# Patient Record
Sex: Female | Born: 1960 | Race: White | Hispanic: No | Smoking: Current every day smoker
Health system: Southern US, Community
[De-identification: ages and names within clinical notes are randomized; demographics above are authoritative.]

## PROBLEM LIST (undated history)

## (undated) DIAGNOSIS — J449 Chronic obstructive pulmonary disease, unspecified: Secondary | ICD-10-CM

## (undated) DIAGNOSIS — K219 Gastro-esophageal reflux disease without esophagitis: Secondary | ICD-10-CM

## (undated) DIAGNOSIS — C801 Malignant (primary) neoplasm, unspecified: Secondary | ICD-10-CM

## (undated) DIAGNOSIS — F1990 Other psychoactive substance use, unspecified, uncomplicated: Secondary | ICD-10-CM

## (undated) DIAGNOSIS — F32A Depression, unspecified: Secondary | ICD-10-CM

## (undated) DIAGNOSIS — K759 Inflammatory liver disease, unspecified: Secondary | ICD-10-CM

## (undated) DIAGNOSIS — R519 Headache, unspecified: Secondary | ICD-10-CM

## (undated) DIAGNOSIS — M810 Age-related osteoporosis without current pathological fracture: Secondary | ICD-10-CM

## (undated) DIAGNOSIS — T8859XA Other complications of anesthesia, initial encounter: Secondary | ICD-10-CM

## (undated) DIAGNOSIS — M797 Fibromyalgia: Secondary | ICD-10-CM

## (undated) DIAGNOSIS — M1990 Unspecified osteoarthritis, unspecified site: Secondary | ICD-10-CM

## (undated) DIAGNOSIS — F419 Anxiety disorder, unspecified: Secondary | ICD-10-CM

## (undated) DIAGNOSIS — K7460 Unspecified cirrhosis of liver: Secondary | ICD-10-CM

## (undated) HISTORY — PX: ABDOMINAL HYSTERECTOMY: SHX81

## (undated) HISTORY — DX: Age-related osteoporosis without current pathological fracture: M81.0

## (undated) HISTORY — PX: KNEE SURGERY: SHX244

## (undated) HISTORY — PX: FACIAL RECONSTRUCTION SURGERY: SHX631

---

## 2007-09-30 DIAGNOSIS — F1910 Other psychoactive substance abuse, uncomplicated: Secondary | ICD-10-CM

## 2007-09-30 HISTORY — DX: Other psychoactive substance abuse, uncomplicated: F19.10

## 2010-09-29 DIAGNOSIS — I219 Acute myocardial infarction, unspecified: Secondary | ICD-10-CM

## 2010-09-29 HISTORY — DX: Acute myocardial infarction, unspecified: I21.9

## 2021-01-09 ENCOUNTER — Encounter (HOSPITAL_COMMUNITY): Payer: Self-pay | Admitting: Emergency Medicine

## 2021-01-09 ENCOUNTER — Emergency Department (HOSPITAL_COMMUNITY)
Admission: EM | Admit: 2021-01-09 | Discharge: 2021-01-10 | Disposition: A | Discharge status: Home / Self Care | Payer: Medicaid Other | Attending: Emergency Medicine | Admitting: Emergency Medicine

## 2021-01-09 ENCOUNTER — Other Ambulatory Visit: Payer: Self-pay

## 2021-01-09 DIAGNOSIS — S8991XA Unspecified injury of right lower leg, initial encounter: Secondary | ICD-10-CM | POA: Diagnosis present

## 2021-01-09 DIAGNOSIS — S8011XA Contusion of right lower leg, initial encounter: Secondary | ICD-10-CM

## 2021-01-09 DIAGNOSIS — S8001XA Contusion of right knee, initial encounter: Secondary | ICD-10-CM | POA: Insufficient documentation

## 2021-01-09 DIAGNOSIS — Y92002 Bathroom of unspecified non-institutional (private) residence single-family (private) house as the place of occurrence of the external cause: Secondary | ICD-10-CM | POA: Diagnosis not present

## 2021-01-09 DIAGNOSIS — W010XXA Fall on same level from slipping, tripping and stumbling without subsequent striking against object, initial encounter: Secondary | ICD-10-CM | POA: Diagnosis not present

## 2021-01-09 HISTORY — DX: Fibromyalgia: M79.7

## 2021-01-09 NOTE — ED Triage Notes (Signed)
Patient slipped and fell at bathroom this evening , no LOC , reports pain at right lower leg with mild swelling .

## 2021-01-09 NOTE — ED Provider Notes (Signed)
MSE was initiated and I personally evaluated the patient and placed orders (if any) at  11:46 PM on January 09, 2021.  After bath one week ago, slipped and fell with leg folded under her. Pain at right shin. Didn't hit her head. No abdominal pain, no back pain.   Also c/o untreated fibromyalgia, requesting gabapentin as she has not established with PCP.   Today's Vitals   01/09/21 2252  BP: (!) 128/93  Pulse: 78  Resp: 20  Temp: 97.9 F (36.6 C)  TempSrc: Oral  SpO2: 100%   There is no height or weight on file to calculate BMI.  Right leg without redness, swelling or deformity. Light weight bearing.  The patient appears stable so that the remainder of the MSE may be completed by another provider.   Charlann Lange, PA-C 01/09/21 2349    Ezequiel Essex, MD 01/10/21 [phone]

## 2021-01-10 ENCOUNTER — Emergency Department (HOSPITAL_COMMUNITY): Payer: Medicaid Other

## 2021-01-10 ENCOUNTER — Ambulatory Visit (HOSPITAL_COMMUNITY): Payer: Medicaid Other | Attending: Emergency Medicine

## 2021-01-10 ENCOUNTER — Other Ambulatory Visit (HOSPITAL_COMMUNITY): Payer: Medicaid Other

## 2021-01-10 MED ORDER — IBUPROFEN 400 MG PO TABS
400.0000 mg | ORAL_TABLET | Freq: Once | ORAL | Status: AC
  Administered 2021-01-10: 400 mg via ORAL
  Filled 2021-01-10: qty 1

## 2021-01-10 MED ORDER — GABAPENTIN 300 MG PO CAPS
300.0000 mg | ORAL_CAPSULE | Freq: Three times a day (TID) | ORAL | 0 refills | Status: DC
Start: 2021-01-10 — End: 2023-03-27

## 2021-01-10 NOTE — ED Provider Notes (Signed)
[facility] DEPART[facility] Provider Note   CSN: [id number] Arrival date & time: 01/09/21  2246     History Chief Complaint  Patient presents with  . Fall    Cassidy Stevens is a 60 y.o. female.  Patient with history of fibromyalgia.  She is here with right anterior lower leg pain that occurred after trying to get out of the tub approximately 1 week ago.  She states her right leg buckled underneath her and she landed with her weight on her right shin.  Did not hit her head or lose consciousness.  Complains of pain to her right anterior lower leg.  No neck or back pain.  No chest pain or abdominal pain.  No focal weakness, numbness or tingling. States she has had some bruising to her knees bilaterally but this is chronic.  Denies landing on her knees.  Denies any blood thinner use other than BC powders.  The history is provided by the patient.  Fall Pertinent negatives include no chest pain, no abdominal pain and no headaches.       Past Medical History:  Diagnosis Date  . Fibromyalgia     There are no problems to display for this patient.   Past Surgical History:  Procedure Laterality Date  . ABDOMINAL HYSTERECTOMY    . FACIAL RECONSTRUCTION SURGERY    . KNEE SURGERY       OB History   No obstetric history on file.     No family history on file.  Social History   Tobacco Use  . Smoking status: Never Smoker  . Smokeless tobacco: Never Used  Substance Use Topics  . Alcohol use: Never  . Drug use: Never    Home Medications Prior to Admission medications   Not on File    Allergies    Patient has no known allergies.  Review of Systems   Review of Systems  Constitutional: Negative for activity change, appetite change and fever.  HENT: Negative for congestion and rhinorrhea.   Respiratory: Negative for chest tightness.   Cardiovascular: Positive for leg swelling. Negative for chest pain.  Gastrointestinal: Negative for  abdominal pain, nausea and vomiting.  Genitourinary: Negative for dysuria and hematuria.  Musculoskeletal: Positive for arthralgias and myalgias.  Skin: Negative for rash.  Neurological: Negative for dizziness, weakness and headaches.   all other systems are negative except as noted in the HPI and PMH.   Physical Exam Updated Vital Signs BP (!) 128/93 (BP Location: Left Arm)   Pulse 78   Temp 97.9 F (36.6 C) (Oral)   Resp 20   Ht 5\' 4"  (1.626 m)   Wt 100 kg   SpO2 100%   BMI 37.84 kg/m   Physical Exam Vitals and nursing note reviewed.  Constitutional:      General: She is not in acute distress.    Appearance: She is well-developed.  HENT:     Head: Normocephalic and atraumatic.     Mouth/Throat:     Pharynx: No oropharyngeal exudate.  Eyes:     Conjunctiva/sclera: Conjunctivae normal.     Pupils: Pupils are equal, round, and reactive to light.  Neck:     Comments: No meningismus. Cardiovascular:     Rate and Rhythm: Normal rate and regular rhythm.     Heart sounds: Normal heart sounds. No murmur heard.   Pulmonary:     Effort: Pulmonary effort is normal. No respiratory distress.     Breath sounds: Normal breath  sounds.  Abdominal:     Palpations: Abdomen is soft.     Tenderness: There is no abdominal tenderness. There is no guarding or rebound.  Musculoskeletal:        General: Swelling and tenderness present. Normal range of motion.     Cervical back: Normal range of motion and neck supple.     Comments: Ecchymosis to right anterior knee.  No bony tenderness.  Flexion and extension are intact without ligament laxity.  Tenderness to distal anterior shin without deformity.  Intact DP and PT pulse on Right.  No tenderness to ankle  Skin:    General: Skin is warm.  Neurological:     Mental Status: She is alert and oriented to person, place, and time.     Cranial Nerves: No cranial nerve deficit.     Motor: No abnormal muscle tone.     Coordination:  Coordination normal.     Comments:  5/5 strength throughout. CN 2-12 intact.Equal grip strength.   Psychiatric:        Behavior: Behavior normal.     ED Results / Procedures / Treatments   Labs (all labs ordered are listed, but only abnormal results are displayed) Labs Reviewed - No data to display  EKG None  Radiology DG Tibia/Fibula Right  Result Date: 01/10/2021 CLINICAL DATA:  Lower extremity pain after fall EXAM: RIGHT TIBIA AND FIBULA - 2 VIEW COMPARISON:  None. FINDINGS: No fracture or malalignment. Moderate arthritis at the knee with knee effusion. IMPRESSION: No acute osseous abnormality. Electronically Signed   By: Donavan Foil M.D.   On: 01/10/2021 00:16   DG Ankle Complete Right  Result Date: 01/10/2021 CLINICAL DATA:  Status post fall. EXAM: RIGHT ANKLE - COMPLETE 3+ VIEW COMPARISON:  None. FINDINGS: There is no evidence of fracture, dislocation, or joint effusion. There is no evidence of arthropathy or other focal bone abnormality. Mild diffuse soft tissue swelling is seen. IMPRESSION: 1. Mild diffuse soft tissue swelling without an acute osseous abnormality. Electronically Signed   By: Virgina Norfolk M.D.   On: 01/10/2021 03:11   DG Knee Complete 4 Views Right  Result Date: 01/10/2021 CLINICAL DATA:  Status post fall. EXAM: RIGHT KNEE - COMPLETE 4+ VIEW COMPARISON:  None. FINDINGS: No evidence of an acute fracture or dislocation. There is marked severity patellofemoral and medial tibiofemoral compartment space narrowing. Moderate severity lateral tibiofemoral compartment space narrowing is also seen. A small to moderate sized joint effusion is noted. IMPRESSION: 1. No evidence of acute fracture or dislocation. 2. Moderate to marked severity degenerative changes with a small to moderate sized joint effusion. Electronically Signed   By: Virgina Norfolk M.D.   On: 01/10/2021 03:11    Procedures Procedures   Medications Ordered in ED Medications  ibuprofen (ADVIL)  tablet 400 mg (has no administration in time range)    ED Course  I have reviewed the triage vital signs and the nursing notes.  Pertinent labs & imaging results that were available during my care of the patient were reviewed by me and considered in my medical decision making (see chart for details).    MDM Rules/Calculators/A&P                         Fall with right leg pain.  Neurovascular intact.  No head injury  X-rays are negative for fracture.  Does show arthritis and small joint effusion.  Low suspicion for septic joint.  Low suspicion for  DVT but will arrange for venous Doppler in the morning given her ongoing pain.  Patient requesting gabapentin as she does not have a PCP in this area.  Discussed supportive care for her leg pain and PCP follow-up.  Return precautions discussed Final Clinical Impression(s) / ED Diagnoses Final diagnoses:  Contusion of right leg, initial encounter    Rx / DC Orders ED Discharge Orders    None       Kayton Dunaj, Annie Main, MD 01/10/21 0530

## 2021-01-10 NOTE — Discharge Instructions (Signed)
Your x-rays negative for fracture.  It does show arthritis in your knee.  Schedule an ultrasound of your leg to rule out a blood clot at the number provided.  Establish care with a primary doctor.  Return to the ED with worsening pain, difficulty breathing, chest pain, any other concerns.

## 2021-01-14 ENCOUNTER — Emergency Department (HOSPITAL_BASED_OUTPATIENT_CLINIC_OR_DEPARTMENT_OTHER)
Admit: 2021-01-14 | Discharge: 2021-01-14 | Disposition: A | Discharge status: Home / Self Care | Payer: Medicaid Other | Attending: Emergency Medicine | Admitting: Emergency Medicine

## 2021-01-14 ENCOUNTER — Other Ambulatory Visit: Payer: Self-pay

## 2021-01-14 ENCOUNTER — Emergency Department (HOSPITAL_COMMUNITY)
Admission: EM | Admit: 2021-01-14 | Discharge: 2021-01-14 | Discharge status: Against Medical Advice | Payer: Medicaid Other | Attending: Emergency Medicine | Admitting: Emergency Medicine

## 2021-01-14 DIAGNOSIS — Z5321 Procedure and treatment not carried out due to patient leaving prior to being seen by health care provider: Secondary | ICD-10-CM | POA: Diagnosis not present

## 2021-01-14 DIAGNOSIS — R52 Pain, unspecified: Secondary | ICD-10-CM | POA: Insufficient documentation

## 2021-01-14 DIAGNOSIS — M79604 Pain in right leg: Secondary | ICD-10-CM

## 2021-01-14 NOTE — Progress Notes (Signed)
Lower extremity venous has been completed.   Preliminary results in CV Proc.   Abram Sander 01/14/2021 11:38 AM

## 2021-01-30 ENCOUNTER — Encounter: Payer: Self-pay | Admitting: Internal Medicine

## 2021-03-05 ENCOUNTER — Institutional Professional Consult (permissible substitution): Payer: Medicaid Other | Admitting: Internal Medicine

## 2021-04-02 ENCOUNTER — Institutional Professional Consult (permissible substitution): Payer: Medicaid Other | Admitting: Pulmonary Disease

## 2021-05-07 ENCOUNTER — Encounter: Payer: Self-pay | Admitting: Pulmonary Disease

## 2021-05-07 ENCOUNTER — Other Ambulatory Visit: Payer: Self-pay

## 2021-05-07 ENCOUNTER — Ambulatory Visit (INDEPENDENT_AMBULATORY_CARE_PROVIDER_SITE_OTHER): Payer: Medicaid Other | Admitting: Pulmonary Disease

## 2021-05-07 VITALS — BP 118/70 | HR 100 | Temp 97.2°F | Ht 64.0 in | Wt 224.0 lb

## 2021-05-07 DIAGNOSIS — R059 Cough, unspecified: Secondary | ICD-10-CM | POA: Diagnosis not present

## 2021-05-07 DIAGNOSIS — J449 Chronic obstructive pulmonary disease, unspecified: Secondary | ICD-10-CM | POA: Diagnosis not present

## 2021-05-07 MED ORDER — TRELEGY ELLIPTA 200-62.5-25 MCG/INH IN AEPB: 1.0000 | INHALATION_SPRAY | Freq: Every day | RESPIRATORY_TRACT | 11 refills | Status: AC

## 2021-05-07 NOTE — Progress Notes (Signed)
$'@Patient'D$  ID: Cassidy Stevens, female    DOB: March 22, 1961, 60 y.o.   MRN: [id number]  Chief Complaint  Patient presents with   Consult    More SOB, smoker, COPD    Referring provider: Terald Sleeper, PA-C  HPI:   60 y.o. woman whom we are seeing in consultation for evaluation of shortness of breath.  Referring provider note reviewed.  Patient reports longstanding history of shortness of breath.  Seems getting worse.  Moved here from [place] few months ago.  There had a doctor.  Reports doing PFTs.  Reports being diagnosed with COPD.  She is maintained on Wixela.  She reports good adherence to this.  Occasional need for prednisone.  She has cough.  Sometimes productive.  She uses albuterol every day, up to 2 times a day.  Helps with the cough and some shortness of breath.  No time of day with things are better or worse.  No seasonal environmental changes.  Is worse since moving to [place] but not drastically so.  No position to make things better or worse no relieving or exacerbating factors.   PMH: Tobacco abuse, migraines Surgical history: Hysterectomy, knee surgery Family history:History reviewed. No pertinent family history.  No respiratory illnesses unfortunately relatives. Social history: Smoker, lives in [place], moved from [place] to live there through 20/2021  ACT:  No flowsheet data found.  MMRC: No flowsheet data found.  Epworth:  No flowsheet data found.  Tests:   FENO:  No results found for: NITRICOXIDE  PFT: No flowsheet data found.  WALK:  No flowsheet data found.  Imaging: No results found.  Lab Results:  CBC No results found for: WBC, RBC, HGB, HCT, PLT, MCV, MCH, MCHC, RDW, LYMPHSABS, MONOABS, EOSABS, BASOSABS  BMET No results found for: NA, K, CL, CO2, GLUCOSE, BUN, CREATININE, CALCIUM, GFRNONAA, GFRAA  BNP No results found for: BNP  ProBNP No results found for: PROBNP  Specialty Problems   None  No Known  Allergies   There is no immunization history on file for this patient.  Past Medical History:  Diagnosis Date   Fibromyalgia     Tobacco History: Social History   Tobacco Use  Smoking Status Never  Smokeless Tobacco Never   Counseling given: Yes   Continue to not smoke  Outpatient Encounter Medications as of 05/07/2021  Medication Sig   clindamycin (CLEOCIN) 150 MG capsule    clobetasol ointment (TEMOVATE) 0.05 %    Fluticasone-Umeclidin-Vilant (TRELEGY ELLIPTA) 200-62.5-25 MCG/INH AEPB Inhale 1 puff into the lungs daily.   gabapentin (NEURONTIN) 300 MG capsule Take 1 capsule (300 mg total) by mouth 3 (three) times daily.   gabapentin (NEURONTIN) 600 MG tablet    ibuprofen (ADVIL) 600 MG tablet    naproxen (NAPROSYN) 500 MG tablet    SUMAtriptan (IMITREX) 50 MG tablet    No facility-administered encounter medications on file as of 05/07/2021.     Review of Systems  Review of Systems  No chest pain with exertion.  No orthopnea or PND.  Comprehensive review of systems otherwise negative.    Physical Exam  BP 118/70 (BP Location: Left Arm, Cuff Size: Normal)   Pulse 100   Temp (!) 97.2 F (36.2 C) (Oral)   Ht '5\' 4"'$  (1.626 m)   Wt 224 lb (101.6 kg)   SpO2 98%   BMI 38.45 kg/m   Wt Readings from Last 5 Encounters:  05/07/21 224 lb (101.6 kg)  01/09/21 220 lb 7.4 oz (100  kg)    BMI Readings from Last 5 Encounters:  05/07/21 38.45 kg/m  01/09/21 37.84 kg/m     Physical Exam General: Sitting in chair, no acute distress Eyes: EOMI, icterus Neck: Supple, no JVP Pulmonary: Normal work of breathing, on room air Cardiovascular: Regular rhythm, no murmurs Abdomen: Nondistended, bowel sounds present MSK: No synovitis, joint effusion Neuro: Normal gait, no weakness psych psych: Normal mood, full affect   Assessment & Plan:   Dyspnea on exertion: Likely multifactorial.  Some degree of deconditioning as she is less active.  Suspect related to history of  cigarette smoking.  She reports PFTs at prior doctor.  She think she is had PFTs.  On Wixela.  Escalate to Trelegy given symptoms.  Cough: Clinically, prescription of chronic bronchitis.  Likely related to cigarette smoke.  Trelegy as above.  Tobacco abuse: Ongoing.  She is precontemplative in terms of quitting.   Return in about 3 months (around 08/07/2021).   Lanier Clam, MD 06/24/2021

## 2021-05-07 NOTE — Patient Instructions (Addendum)
Nice to meet you  Fill out paperwork so we can get records from Dr. Lorra Hals in [place], [place].  Fill out paperwork for manufacturer assistance for Trelegy.   Use Trelegy 1 puff daily. You can stop Wixela once you get Trelegy. Rinse mouth after every use. Continue to use albuterol as needed.  Return to clinic in 3 months or sooner as needed.

## 2021-10-28 ENCOUNTER — Ambulatory Visit: Payer: Medicaid Other | Admitting: Pulmonary Disease

## 2021-11-08 ENCOUNTER — Encounter: Payer: Self-pay | Admitting: Pulmonary Disease

## 2021-11-08 ENCOUNTER — Other Ambulatory Visit: Payer: Self-pay

## 2021-11-08 ENCOUNTER — Ambulatory Visit: Payer: Medicaid Other | Admitting: Pulmonary Disease

## 2021-11-08 VITALS — BP 124/68 | HR 107 | Temp 98.5°F | Ht 64.0 in | Wt 183.0 lb

## 2021-11-08 DIAGNOSIS — J449 Chronic obstructive pulmonary disease, unspecified: Secondary | ICD-10-CM | POA: Diagnosis not present

## 2021-11-08 DIAGNOSIS — R053 Chronic cough: Secondary | ICD-10-CM | POA: Diagnosis not present

## 2021-11-08 NOTE — Patient Instructions (Addendum)
Nice to see you again   Continue Trelegy 1 puff once daily  Use albuterol as needed  Return to clinic in 3 months or sooner as needed

## 2021-11-08 NOTE — Progress Notes (Signed)
@Patient  ID: Cassidy Stevens, female    DOB: 03/20/1961, 61 y.o.   MRN: [id number]  Chief Complaint  Patient presents with   Follow-up    Follow up from august. Pt states she still has the wheezing and coughing at night. She states its a lot better than before. Went to the ER when she was in [place].     Referring provider: Theodoro Clock  HPI:   61 y.o. woman whom we are seeing in follow-up for evaluation of shortness of breath felt to be related to COPD based on PFTs in [place] although I am unable to review them.  Patient doing okay.  Trelegy was added to regimen at last visit from [facility].  This is helped a bit in clearing up phlegm, help with shortness of breath during the day.  Still more short of breath with cough worse at night.  Using albuterol once a day, usually in the evenings.  Was doing well but visiting family in [place].  Came down with worsening shortness of breath and cough.  Went to the ED.  Chest x-ray reviewed results reviewed.  Mild interstitial prominence they were concern for pulmonary edema on the read.  Sounds like clinically bronchitis.  She was treated for bronchitis with antibiotics and prednisone.  This helped a bit.  Now back in [place].  Symptoms back to baseline.  She reports good adherence to daily Trelegy.  Patient reports longstanding history of shortness of breath.  Seems getting worse.  Moved here from [place] few months ago.  There had a doctor.  Reports doing PFTs.  Reports being diagnosed with COPD.  She is maintained on Wixela.  She reports good adherence to this.  Occasional need for prednisone.  She has cough.  Sometimes productive.  She uses albuterol every day, up to 2 times a day.  Helps with the cough and some shortness of breath.  No time of day with things are better or worse.  No seasonal environmental changes.  Is worse since moving to [place] but not drastically so.  No position to make things better or worse no  relieving or exacerbating factors.   PMH: Tobacco abuse, migraines Surgical history: Hysterectomy, knee surgery Family history:History reviewed. No pertinent family history.  No respiratory illnesses unfortunately relatives. Social history: Smoker, lives in [place], moved from [place] to live there through 20/2021  ACT:  No flowsheet data found.  MMRC: No flowsheet data found.  Epworth:  No flowsheet data found.  Tests:   FENO:  No results found for: NITRICOXIDE  PFT: No flowsheet data found.  WALK:  No flowsheet data found.  Imaging: No results found.  Lab Results:  CBC No results found for: WBC, RBC, HGB, HCT, PLT, MCV, MCH, MCHC, RDW, LYMPHSABS, MONOABS, EOSABS, BASOSABS  BMET No results found for: NA, K, CL, CO2, GLUCOSE, BUN, CREATININE, CALCIUM, GFRNONAA, GFRAA  BNP No results found for: BNP  ProBNP No results found for: PROBNP  Specialty Problems   None  No Known Allergies   There is no immunization history on file for this patient.  Past Medical History:  Diagnosis Date   Fibromyalgia     Tobacco History: Social History   Tobacco Use  Smoking Status Never  Smokeless Tobacco Never   Counseling given: Not Answered   Continue to not smoke  Outpatient Encounter Medications as of 11/08/2021  Medication Sig   albuterol (VENTOLIN HFA) 108 (90 Base) MCG/ACT inhaler    clindamycin (CLEOCIN)  150 MG capsule    clobetasol ointment (TEMOVATE) 0.05 %    Fluticasone-Umeclidin-Vilant (TRELEGY ELLIPTA) 200-62.5-25 MCG/INH AEPB Inhale 1 puff into the lungs daily.   gabapentin (NEURONTIN) 300 MG capsule Take 1 capsule (300 mg total) by mouth 3 (three) times daily.   gabapentin (NEURONTIN) 600 MG tablet    ibuprofen (ADVIL) 600 MG tablet    naproxen (NAPROSYN) 500 MG tablet    SUMAtriptan (IMITREX) 50 MG tablet    No facility-administered encounter medications on file as of 11/08/2021.     Review of Systems  Review of Systems  No chest  pain with exertion.  No orthopnea or PND.  Comprehensive review of systems otherwise negative.    Physical Exam  BP 124/68 (BP Location: Left Arm, Patient Position: Sitting, Cuff Size: Normal)    Pulse (!) 107    Temp 98.5 F (36.9 C) (Oral)    Ht 5\' 4"  (1.626 m)    Wt 183 lb (83 kg)    SpO2 98%    BMI 31.41 kg/m   Wt Readings from Last 5 Encounters:  11/08/21 183 lb (83 kg)  05/07/21 224 lb (101.6 kg)  01/09/21 220 lb 7.4 oz (100 kg)    BMI Readings from Last 5 Encounters:  11/08/21 31.41 kg/m  05/07/21 38.45 kg/m  01/09/21 37.84 kg/m     Physical Exam General: Sitting in chair, no acute distress Eyes: EOMI, icterus Neck: Supple, no JVP Pulmonary: Normal work of breathing, on room air Cardiovascular: Regular rhythm, no murmurs MSK: No synovitis, joint effusion Neuro: Normal gait, no weakness  psych: Normal mood, full affect   Assessment & Plan:   Dyspnea on exertion: Likely multifactorial.  Some degree of deconditioning as she is less active.  Suspect related to history of cigarette smoking.  She reports PFTs at prior doctor consistent with COPD.  Improved with Trelegy was increased from [facility] 04/2021).  Continue albuterol as needed  Cough: Clinically, description consistent with chronic bronchitis.  Likely related to cigarette smoke.  Trelegy as above.  Albuterol as needed.  Tobacco abuse: Ongoing.  She is precontemplative in terms of quitting.   Return in about 3 months (around 02/05/2022).   Lanier Clam, MD 11/08/2021

## 2021-11-26 ENCOUNTER — Ambulatory Visit: Payer: Medicaid Other | Admitting: Neurology

## 2021-11-27 ENCOUNTER — Encounter: Payer: Self-pay | Admitting: Neurology

## 2021-11-27 ENCOUNTER — Ambulatory Visit: Payer: Medicaid Other | Admitting: Neurology

## 2021-11-27 VITALS — BP 132/82 | HR 86 | Ht 64.0 in | Wt 179.8 lb

## 2021-11-27 DIAGNOSIS — M542 Cervicalgia: Secondary | ICD-10-CM

## 2021-11-27 DIAGNOSIS — M79605 Pain in left leg: Secondary | ICD-10-CM

## 2021-11-27 DIAGNOSIS — M79604 Pain in right leg: Secondary | ICD-10-CM | POA: Diagnosis not present

## 2021-11-27 DIAGNOSIS — G8929 Other chronic pain: Secondary | ICD-10-CM

## 2021-11-27 DIAGNOSIS — M545 Low back pain, unspecified: Secondary | ICD-10-CM

## 2021-11-27 NOTE — Patient Instructions (Signed)
Follow up with [facility] ?

## 2021-11-27 NOTE — Progress Notes (Signed)
[facility]    Provider:  Dr Jaynee Eagles Requesting Provider: Terald Sleeper, PA-C Primary Care Provider:  Terald Sleeper, PA-C  CC:  leg pain  HPI:  Cassidy Stevens is a 61 y.o. female here as requested by Terald Sleeper, PA-C for neuropathy.  I reviewed Particia Stevens notes: PMHx fibromyalgia, migraine, arthritis, COPD, alcoholism in recovery, drug addiction in remission, leg edema, varicose veins, neuropathic pain (restarted gabapentin for pain), anxiety, panic disorder, PTSD, depression, sciatica, MI 2012, on gabapentin 800 mg 3 times a day, is a patient of [name], she has continued neuropathy in the lower legs, she is not falling and can feel the legs, she does not notice a change in circulation, skin temperature color, she is not outside in extreme temperatures, she aches through the lower legs, left greater than right, she has not been to neurology before, she does not know of any family history or neurologic conditions.   she has tingling in her hip and spine. No tingling or numbness in the feet. the pain is in her hips and in the middle of her back. She is a patient of [facility]. She says the pain travels up from the bottom of her feet to her back and hips. No pain in the feet. Pain is in the low back and hips and shoulders. She goes to [facility] and they are taking care of her neck. She has skin lesions. She denies radicular pain. The legs are aching.  She is active patient of [facility] for her neck. No problems with the toes. No weakness. No falls. She has carpal tunnel syndrome and [facility] is treating. She has knee pain and is wearing a brace today. She says her symptoms that she was sent for have resolved, she felt something crawling on her feet and radiating up, she thought she had worms, no symptoms in the feet currently. She is also talking to [facility] for her hips and low back. No other focal neurologic deficits,  associated symptoms, inciting events or modifiable factors.   Review of Systems: Patient complains of symptoms per HPI as well as the following symptoms rash and scabs. Pertinent negatives and positives per HPI. All others negative.   Social History   Socioeconomic History   Marital status: Single    Spouse name: Not on file   Number of children: Not on file   Years of education: Not on file   Highest education level: Not on file  Occupational History   Not on file  Tobacco Use   Smoking status: Never   Smokeless tobacco: Never  Substance and Sexual Activity   Alcohol use: Never   Drug use: Never   Sexual activity: Not on file  Other Topics Concern   Not on file  Social History Narrative   Not on file   Social Determinants of Health   Financial Resource Strain: Not on file  Food Insecurity: Not on file  Transportation Needs: Not on file  Physical Activity: Not on file  Stress: Not on file  Social Connections: Not on file  Intimate Partner Violence: Not on file    Family History  Problem Relation Age of Onset   Neuropathy Maternal Aunt    Neuropathy Maternal Uncle     Past Medical History:  Diagnosis Date   Fibromyalgia     Patient Active Problem List   Diagnosis Date Noted   Pain in both lower extremities 11/27/2021   Chronic midline  low back pain without sciatica 11/27/2021   Chronic neck pain 11/27/2021    Past Surgical History:  Procedure Laterality Date   ABDOMINAL HYSTERECTOMY     FACIAL RECONSTRUCTION SURGERY     KNEE SURGERY      Current Outpatient Medications  Medication Sig Dispense Refill   albuterol (VENTOLIN HFA) 108 (90 Base) MCG/ACT inhaler      clobetasol ointment (TEMOVATE) 0.05 %      Fluticasone-Umeclidin-Vilant (TRELEGY ELLIPTA) 200-62.5-25 MCG/INH AEPB Inhale 1 puff into the lungs daily. 60 each 11   gabapentin (NEURONTIN) 300 MG capsule Take 1 capsule (300 mg total) by mouth 3 (three) times daily. 30 capsule 0   gabapentin  (NEURONTIN) 600 MG tablet      ibuprofen (ADVIL) 600 MG tablet      naproxen (NAPROSYN) 500 MG tablet      SUMAtriptan (IMITREX) 50 MG tablet      No current facility-administered medications for this visit.    Allergies as of 11/27/2021   (No Known Allergies)    Vitals: BP 132/82    Pulse 86    Ht 5\' 4"  (1.626 m)    Wt 179 lb 12.8 oz (81.6 kg)    BMI 30.86 kg/m  Last Weight:  Wt Readings from Last 1 Encounters:  11/27/21 179 lb 12.8 oz (81.6 kg)   Last Height:   Ht Readings from Last 1 Encounters:  11/27/21 5\' 4"  (1.626 m)     Physical exam: Exam: Gen: NAD, conversant, adentulous                     CV: RRR, no MRG. No Carotid Bruits. No peripheral edema. Eyes: Conjunctivae clear without exudates or hemorrhage Skin: she has scabbing on her forearms, hyperpigmentation around the ankles and lower legs.   Neuro: Detailed Neurologic Exam  Speech:    Speech is normal; fluent and spontaneous with normal comprehension.  Cognition:    The patient is oriented to person, place, and time;     recent and remote memory intact;     language fluent;     normal attention, concentration,     fund of knowledge Cranial Nerves:    The pupils are equal, round, and reactive to light. Pupils too small to visualize fundi. Visual fields are full to finger confrontation. Extraocular movements are intact. Trigeminal sensation is intact and the muscles of mastication are normal. The face is symmetric. The palate elevates in the midline. Hearing intact. Voice is normal. Shoulder shrug is normal. The tongue has normal motion without fasciculations.   Coordination:    Normal finger to nose and heel to shin.   Gait:    antalgic (states knee pain)  Motor Observation:    No asymmetry, no atrophy, and no involuntary movements noted. Tone:    Normal muscle tone.    Posture:    Posture is normal. normal erect    Strength:    Strength is V/V in the upper and lower limbs.      Sensation:  intact to pin prick, vibration and proprioception distally in the feet     Reflex Exam:  DTR's:   Absent AJs otherwise Deep tendon reflexes in the upper and lower extremities are brisk bilaterally.   Toes:    The toes are equiv bilaterally.   Clonus:    Clonus is absent.    Assessment/Plan:  61 y.o. female here as requested by Terald Sleeper, PA-C for neuropathy.  PMHx fibromyalgia, migraine,  arthritis, COPD, alcoholism in recovery, drug addiction in remission, leg edema, varicose veins, neuropathic pain (restarted gabapentin for pain), anxiety, panic disorder, PTSD, depression, sciatica, MI 2012, on gabapentin 800 mg 3 times a day, is a patient of [facility],  Per Particia Stevens she  has continued neuropathy in the lower legs, she is not falling and can feel the legs,she aches through the lower legs, left greater than right.  Patient's neurologic exam is surprisingly non focal. Her strength is normal. She has intact sensation distally in the feet to pin prick, vibration and proprioception. I believe her leg pain is coming from her low back and hips as her main complaints are neck pain, low back pain, hip pain and knee pain. However she is already a patient of [facility] and is getting injections into her neck and addressing the low back pain and joint pain with [facility]. She denies any numbness, tingling burning or any pain in the feet or sensory changes in the legs also denies radiculopathy in the lowers. I cannot find evidence of a polyneuropathy distally in her feet which is surprising given her hx of alcoholism. At this time, I do not think there is anything for neurology to do here, she has to follow up with [facility] and her primary care.   Cc: Ilean Skill, PA-C  Sarina Ill, MD  [facility] [address] [place], [place] [address]  Phone [phone] Fax [phone]  I spent 45 minutes  of face-to-face and non-face-to-face time with patient on the  1. Pain in both lower extremities   2. Chronic midline low back pain without sciatica   3. Chronic neck pain    diagnosis.  This included previsit chart review, lab review, study review, order entry, electronic health record documentation, patient education on the different diagnostic and therapeutic options, counseling and coordination of care, risks and benefits of management, compliance, or risk factor reduction

## 2022-01-30 ENCOUNTER — Other Ambulatory Visit: Payer: Self-pay | Admitting: Nurse Practitioner

## 2022-01-30 DIAGNOSIS — Z1231 Encounter for screening mammogram for malignant neoplasm of breast: Secondary | ICD-10-CM

## 2022-02-03 ENCOUNTER — Ambulatory Visit: Payer: Medicaid Other | Admitting: Pulmonary Disease

## 2022-02-27 ENCOUNTER — Ambulatory Visit: Payer: Medicaid Other

## 2022-07-14 ENCOUNTER — Other Ambulatory Visit (HOSPITAL_COMMUNITY): Payer: Self-pay | Admitting: Internal Medicine

## 2022-07-14 DIAGNOSIS — K8020 Calculus of gallbladder without cholecystitis without obstruction: Secondary | ICD-10-CM

## 2022-07-23 ENCOUNTER — Encounter: Payer: Self-pay | Admitting: Nurse Practitioner

## 2022-08-06 ENCOUNTER — Ambulatory Visit (HOSPITAL_COMMUNITY): Payer: Medicaid Other

## 2022-08-11 ENCOUNTER — Encounter (HOSPITAL_COMMUNITY)
Admission: RE | Admit: 2022-08-11 | Discharge: 2022-08-11 | Disposition: A | Discharge status: Home / Self Care | Payer: Medicaid Other | Source: Ambulatory Visit | Attending: Internal Medicine | Admitting: Internal Medicine

## 2022-08-11 ENCOUNTER — Ambulatory Visit (HOSPITAL_COMMUNITY): Payer: Medicaid Other

## 2022-08-11 DIAGNOSIS — K8020 Calculus of gallbladder without cholecystitis without obstruction: Secondary | ICD-10-CM | POA: Diagnosis present

## 2022-08-11 MED ORDER — TECHNETIUM TC 99M MEBROFENIN IV KIT
5.1000 | PACK | Freq: Once | INTRAVENOUS | Status: AC | PRN
  Administered 2022-08-11: 5.1 via INTRAVENOUS

## 2022-08-12 ENCOUNTER — Other Ambulatory Visit: Payer: Self-pay | Admitting: Internal Medicine

## 2022-08-12 DIAGNOSIS — Z1239 Encounter for other screening for malignant neoplasm of breast: Secondary | ICD-10-CM

## 2022-08-20 ENCOUNTER — Encounter (HOSPITAL_COMMUNITY): Payer: Self-pay

## 2022-08-20 ENCOUNTER — Other Ambulatory Visit (HOSPITAL_COMMUNITY): Payer: Medicaid Other

## 2022-09-01 ENCOUNTER — Ambulatory Visit: Payer: Medicaid Other | Admitting: Nurse Practitioner

## 2022-09-01 ENCOUNTER — Other Ambulatory Visit (INDEPENDENT_AMBULATORY_CARE_PROVIDER_SITE_OTHER): Payer: Medicaid Other

## 2022-09-01 ENCOUNTER — Encounter: Payer: Self-pay | Admitting: Nurse Practitioner

## 2022-09-01 VITALS — BP 122/64 | HR 97 | Ht 64.0 in | Wt 185.0 lb

## 2022-09-01 DIAGNOSIS — R1010 Upper abdominal pain, unspecified: Secondary | ICD-10-CM | POA: Diagnosis not present

## 2022-09-01 DIAGNOSIS — Z8719 Personal history of other diseases of the digestive system: Secondary | ICD-10-CM

## 2022-09-01 DIAGNOSIS — R112 Nausea with vomiting, unspecified: Secondary | ICD-10-CM | POA: Diagnosis not present

## 2022-09-01 LAB — COMPREHENSIVE METABOLIC PANEL
ALT: 40 U/L — ABNORMAL HIGH (ref 0–35)
AST: 40 U/L — ABNORMAL HIGH (ref 0–37)
Albumin: 4.3 g/dL (ref 3.5–5.2)
Alkaline Phosphatase: 101 U/L (ref 39–117)
BUN: 8 mg/dL (ref 6–23)
CO2: 24 mEq/L (ref 19–32)
Calcium: 8.9 mg/dL (ref 8.4–10.5)
Chloride: 99 mEq/L (ref 96–112)
Creatinine, Ser: 0.49 mg/dL (ref 0.40–1.20)
GFR: 101.59 mL/min (ref >60.00)
Glucose, Bld: 89 mg/dL (ref 70–99)
Potassium: 3.9 mEq/L (ref 3.5–5.1)
Sodium: 134 mEq/L — ABNORMAL LOW (ref 135–145)
Total Bilirubin: 0.8 mg/dL (ref 0.2–1.2)
Total Protein: 7.3 g/dL (ref 6.0–8.3)

## 2022-09-01 LAB — CBC WITH DIFFERENTIAL/PLATELET
Basophils Absolute: 0.1 10*3/uL (ref 0.0–0.1)
Basophils Relative: 1.1 % (ref 0.0–3.0)
Eosinophils Absolute: 0 10*3/uL (ref 0.0–0.7)
Eosinophils Relative: 0.1 % (ref 0.0–5.0)
HCT: 47.2 % — ABNORMAL HIGH (ref 36.0–46.0)
Hemoglobin: 16.7 g/dL — ABNORMAL HIGH (ref 12.0–15.0)
Lymphocytes Relative: 35.2 % (ref 12.0–46.0)
Lymphs Abs: 3.5 10*3/uL (ref 0.7–4.0)
MCHC: 35.4 g/dL (ref 30.0–36.0)
MCV: 92.2 fl (ref 78.0–100.0)
Monocytes Absolute: 0.8 10*3/uL (ref 0.1–1.0)
Monocytes Relative: 7.8 % (ref 3.0–12.0)
Neutro Abs: 5.5 10*3/uL (ref 1.4–7.7)
Neutrophils Relative %: 55.8 % (ref 43.0–77.0)
Platelets: 222 10*3/uL (ref 150.0–400.0)
RBC: 5.12 Mil/uL — ABNORMAL HIGH (ref 3.87–5.11)
RDW: 13.5 % (ref 11.5–15.5)
WBC: 9.9 10*3/uL (ref 4.0–10.5)

## 2022-09-01 LAB — PROTIME-INR
INR: 1 ratio (ref 0.8–1.0)
Prothrombin Time: 10.7 s (ref 9.6–13.1)

## 2022-09-01 MED ORDER — ONDANSETRON HCL 4 MG PO TABS: 4.0000 mg | ORAL_TABLET | Freq: Three times a day (TID) | ORAL | 0 refills | Status: DC | PRN

## 2022-09-01 MED ORDER — FAMOTIDINE 20 MG PO TABS: 20.0000 mg | ORAL_TABLET | Freq: Every day | ORAL | 1 refills | Status: DC

## 2022-09-01 NOTE — Patient Instructions (Signed)
Your provider has requested that you go to the basement level for lab work before leaving today. Press "B" on the elevator. The lab is located at the first door on the left as you exit the elevator.   You have been scheduled for an MRI at [facility] on 09/11/22. Your appointment time is 9:00 am. Please arrive to admitting (at main entrance of the hospital) 30 minutes prior to your appointment time for registration purposes. Please make certain not to have anything to eat or drink 4 hours prior to your test. In addition, if you have any metal in your body, have a pacemaker or defibrillator, please be sure to let your ordering physician know. This test typically takes 45 minutes to 1 hour to complete. Should you need to reschedule, please call [phone] to do so.   We have sent the following medications to your pharmacy for you to pick up at your convenience: Zofran 4 mg & Famotidine 20 mg  Due to recent changes in healthcare laws, you may see the results of your imaging and laboratory studies on MyChart before your provider has had a chance to review them.  We understand that in some cases there may be results that are confusing or concerning to you. Not all laboratory results come back in the same time frame and the provider may be waiting for multiple results in order to interpret others.  Please give [place] 48 hours in order for your provider to thoroughly review all the results before contacting the office for clarification of your results.    Thank you for trusting me with your gastrointestinal care!   Carl Best, CRNP
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
## 2022-09-02 LAB — HEPATITIS A ANTIBODY, TOTAL: Hepatitis A AB,Total: REACTIVE — AB

## 2022-09-02 LAB — HEPATITIS B SURFACE ANTIBODY,QUALITATIVE: Hep B S Ab: NONREACTIVE

## 2022-09-02 LAB — HEPATITIS C ANTIBODY: Hepatitis C Ab: REACTIVE — AB

## 2022-09-02 LAB — HEPATITIS C RNA QUANTITATIVE
HCV Quantitative Log: 6.52 log IU/mL — ABNORMAL HIGH
HCV RNA, PCR, QN: 3290000 IU/mL — ABNORMAL HIGH

## 2022-09-02 LAB — HEPATITIS B SURFACE ANTIGEN: Hepatitis B Surface Ag: NONREACTIVE

## 2022-09-03 ENCOUNTER — Telehealth: Payer: Self-pay | Admitting: Nurse Practitioner

## 2022-09-03 NOTE — Telephone Encounter (Signed)
Pt stated that she is concerned about recent labs: Pt was notified that Dr. Lyndel Safe has not reviewed the labs yet and that this typically takes several days:   Please review and advise

## 2022-09-03 NOTE — Telephone Encounter (Signed)
Labs reviewed -Has active hepatitis C infection.  Proceed with ID referral.  Please add on genotype if you can -Immune to hepatitis A but not to B.  Needs vaccine for hepatitis B   I do see Cassidy Stevens has ordered MRI of the abdo.  Will wait for her to complete her note.  pt does need some imaging study for the liver  RG

## 2022-09-03 NOTE — Telephone Encounter (Signed)
Inbound call from patient inquiring about results.  Thank you

## 2022-09-04 NOTE — Progress Notes (Signed)
Agree with the assessment and plan as outlined by Colleen Kennedy-Smith, NP.   Ardel Jagger, DO, FACG [facility]   

## 2022-09-04 NOTE — Telephone Encounter (Signed)
Cassidy Stevens, I called patient this morning as I was off work yesterday and msg was addressed by Dr. Lyndel Safe. I explained to patient that she had exposure to Hep A in the past and she had immunity to Hep A, she does not need a vaccination. Hep B surface antigen was negative, Hep B surface antibody negative which means she does not have immunity to hepatitis B. As Dr. Lyndel Safe requested, pls contact patient to set up Hep B vaccinations.   I explained to the patient that she had active chronic hepatitis C. At the time of her office visit, she indicated her PCP did all of her Hep C labs recently therefore I requested these results but received only a CBC and CMP. I ordered a Hep C RNA 12/4 which is markedly elevated but I did not order a hep C genotype.   Please send patient back to our lab for hep C genotype and lets also include Hep B core total antibody level to complete her hep B serologies.   Please enter ID referral to treat chronic hepatitis C  Patient to proceed with abdominal MRI w/wo contrast as ordered   Dr. Bryan Lemma, refer to office visit as you were listed as supervising physician at time of office visit, let me know if that was incorrect. THX

## 2022-09-05 ENCOUNTER — Other Ambulatory Visit: Payer: Self-pay

## 2022-09-05 DIAGNOSIS — Z8719 Personal history of other diseases of the digestive system: Secondary | ICD-10-CM

## 2022-09-05 NOTE — Telephone Encounter (Signed)
Pt made aware of Carl Best NP recommendations: Orders for labs placed in Epic: Pt made aware: Pt stated that she will come Monday to have labs drawn: Referral to ID entered into EPIC: Pt made aware that if she has not heard anything from them in two weeks then to give our office a call: Pt denied Hep B Vaccine stating that she is not sexually active:  Pt verbalized understanding with all questions answered.

## 2022-09-08 ENCOUNTER — Ambulatory Visit (INDEPENDENT_AMBULATORY_CARE_PROVIDER_SITE_OTHER): Payer: Medicaid Other | Admitting: Internal Medicine

## 2022-09-08 ENCOUNTER — Encounter: Payer: Self-pay | Admitting: Internal Medicine

## 2022-09-08 ENCOUNTER — Other Ambulatory Visit: Payer: Self-pay

## 2022-09-08 VITALS — BP 143/87 | HR 95 | Temp 98.3°F | Ht 64.0 in | Wt 188.0 lb

## 2022-09-08 DIAGNOSIS — B1920 Unspecified viral hepatitis C without hepatic coma: Secondary | ICD-10-CM | POA: Diagnosis present

## 2022-09-08 NOTE — Progress Notes (Signed)
[facility]                                      [address], [place], [place], [address]                                               Phn. [phone]; Fax: [phone]                                                               Date:  Reason for Visit: Hepatitis C    HPI: Cassidy Stevens is a 61 y.o.old female with Chronic hep C, depression, PTSD, alcohol use disorder abstinent x 11 years, remote history of IVDA, last use 23 years ago, migraine headaches, coronary artery disease status post MI, COPD, gallstones referred from GI for management of hepatitis C.  Noted that she was diagnosed with hep C about 30 years ago.  Per GIs note there was a CT on 06/24/2022 showed a slightly lobular hepatic contour is without focal liver lesion, cyst with stable renal shunting suggestive of portal hypertension and mildly distended gallbladder, with which was concerning for cirrhosis.  Viral load at GI office was 3.29 million.  MRI abdominal ordered and plan for EGD if MRI or identified cirrhosis. Pt believes she contrived HCV form husband.  Daughter on the phone during the visit.  Denies Hx of blood transfusion,  [employer] logistics/support/administrative officer.  Nofamily history of liver disease, Hepatitis or Liver cancer.  He has not received treatment to date   Denies any hospitalizations related to liver disease, jaundice, ascites, GI bleeding, mental status changes, abdominal pain and acholic stool.   ROS: Denies yellowish discoloration of sclera and skin, abdominal pain/distension, hematemesis.  Denis cough, fever, chills, nightsweats, nausea, vomiting, diarrhea, constipation, weight loss, recent hospitalizations, rashes, joint complaints, shortness of breath, chest pain, headaches, dysuria .   Current Outpatient Medications on File Prior to Visit  Medication Sig Dispense Refill   albuterol (VENTOLIN HFA) 108 (90 Base) MCG/ACT inhaler      clobetasol  ointment (TEMOVATE) 0.05 %      famotidine (PEPCID) 20 MG tablet Take 1 tablet (20 mg total) by mouth at bedtime. 30 tablet 1   fluticasone-salmeterol (ADVAIR) 100-50 MCG/ACT AEPB Inhale 1 puff into the lungs 2 (two) times daily.     Fluticasone-Umeclidin-Vilant (TRELEGY ELLIPTA) 200-62.5-25 MCG/INH AEPB Inhale 1 puff into the lungs daily. 60 each 11   gabapentin (NEURONTIN) 300 MG capsule Take 1 capsule (300 mg total) by mouth 3 (three) times daily. 30 capsule 0   gabapentin (NEURONTIN) 600 MG tablet      ibuprofen (ADVIL) 600 MG tablet      naproxen (NAPROSYN) 500 MG tablet      ondansetron (ZOFRAN) 4 MG tablet Take 1 tablet (4 mg total) by mouth every 8 (eight) hours as needed for nausea or vomiting. 30 tablet 0   SUMAtriptan (IMITREX) 50 MG tablet      No current facility-administered medications on file prior to visit.   No Known Allergies  Past Medical History:  Diagnosis Date   Fibromyalgia     Past Surgical History:  Procedure Laterality Date   ABDOMINAL HYSTERECTOMY     FACIAL RECONSTRUCTION SURGERY     KNEE SURGERY      Social History   Socioeconomic History   Marital status: Divorced    Spouse name: Not on file   Number of children: 3   Years of education: Not on file   Highest education level: Not on file  Occupational History   Occupation: disabled  Tobacco Use   Smoking status: Every Day    Types: Cigarettes   Smokeless tobacco: Never  Substance and Sexual Activity   Alcohol use: Not Currently   Drug use: Never   Sexual activity: Not on file  Other Topics Concern   Not on file  Social History Narrative   Not on file   Social Determinants of Health   Financial Resource Strain: Not on file  Food Insecurity: Not on file  Transportation Needs: Not on file  Physical Activity: Not on file  Stress: Not on file  Social Connections: Not on file  Intimate Partner Violence: Not on file    Family History  Problem Relation Age of Onset   Neuropathy  Maternal Aunt    Neuropathy Maternal Uncle     Physical exam: There were no vitals taken for this visit.  Gen: Alert and oriented x 3, no acute distress HEENT: [place]/AT, PERL, EOMI, no scleral icterus, no pale conjunctivae, hearing normal, oral mucosa moist Neck: Supple, no lymphadenopathy Cardio: Regular rate and rhythm; +S1 and S2; no murmurs, gallops, or rubs Resp: CTAB; no wheezes, rhonchi, or rales GI: Soft, nontender, nondistended, bowel sounds present GU: Musc: Extremities: No cyanosis, clubbing, or edema; +2 PT and DP pulses Skin: No rashes, lesions, or ecchymoses Neuro: No focal deficits Psych: Calm, cooperative   Laboratory  CBC w diff CMP- 40/40 AST/ALT PT/INR-nl     Assessment/Plan: #Chronic Hepatitis C with VL on 12/4 3.29 M #History of EtOH abuse-11 years ago with last drink #IVDA-remote 23 years ago Prior treatment:  None GT: pending Evidence of cirrhosis: testing pending Interested in treatment: yes Potential DDI Hep A immune Hep B c AB(sAG, sAB NR) HIV HCV genotype HCV RNA Measure of fibrosis U/S with elasto graphy - Declined  all vaccines due to religion. Hep B non-mmune, HAV immune - Spoke with daughter about getting U/S and labwork and futher assesment to assess for cirrhosis. Pt is being followed by GI and MRI pending to assess liver as noted lobular appearance of hepatic contours and splenorenal shunting suggestive of Protal HTN on CT in September 2023   #Gallstones with nausea - No plans for [place] at this time - EGD at time of colonoscopy  #Infrequent rectal bleeding - Colonoscopy 5 to 6 years ago - Colonoscopy scheduled after MRI results Smoking/Alcohol/Illicit substance use    Counseling done on the following -Natural progression of hep c, transmission (avoid sharing personal hygiene equipment), prevention, risks of left untreated and treatment options  -Avoid hepatotoxins like alcohol and excessive acetamaminphen (no more than 2 gram  a day) -Avoid eating raw sea food -Risks of re-infection  -Hepatitis coinfection and vaccination( Pneumococcal vaccination in the cirrhotics   Electronically signed by:  Laurice Record, MD Infectious Diseases  Fax no. [phone]

## 2022-09-09 LAB — HEPATITIS B CORE ANTIBODY, TOTAL: Hep B Core Total Ab: NONREACTIVE

## 2022-09-11 ENCOUNTER — Ambulatory Visit (HOSPITAL_COMMUNITY)
Admission: RE | Admit: 2022-09-11 | Discharge: 2022-09-11 | Disposition: A | Discharge status: Home / Self Care | Payer: Medicaid Other | Source: Ambulatory Visit | Attending: Nurse Practitioner | Admitting: Nurse Practitioner

## 2022-09-11 DIAGNOSIS — Z8719 Personal history of other diseases of the digestive system: Secondary | ICD-10-CM | POA: Diagnosis present

## 2022-09-11 DIAGNOSIS — R1010 Upper abdominal pain, unspecified: Secondary | ICD-10-CM | POA: Diagnosis present

## 2022-09-11 DIAGNOSIS — R112 Nausea with vomiting, unspecified: Secondary | ICD-10-CM | POA: Insufficient documentation

## 2022-09-11 MED ORDER — GADOBUTROL 1 MMOL/ML IV SOLN
8.0000 mL | Freq: Once | INTRAVENOUS | Status: AC | PRN
  Administered 2022-09-11: 8 mL via INTRAVENOUS

## 2022-09-12 ENCOUNTER — Telehealth: Payer: Self-pay

## 2022-09-12 NOTE — Telephone Encounter (Signed)
-----   Message from Lorine Bears, NP sent at 09/11/2022  9:41 AM EST ----- Not previously seen in our office. When you call patient to schedule please ask her if she has previous lumbar MRI imaging/surgery? Would be helpful for her to bring her records. Thanks ----- Message ----- From: Casilda Carls, CMA Sent: 09/11/2022   9:37 AM EST To: Magnus Sinning, MD; Lorine Bears, NP   ----- Message ----- From: Laurann Montana, RT Sent: 09/11/2022   9:11 AM EST To: Casilda Carls, CMA   ----- Message ----- From: [place] Lions Sent: 09/11/2022   9:05 AM EST To: Laurann Montana, RT  Referral for Dr. Ernestina Patches

## 2022-09-12 NOTE — Telephone Encounter (Signed)
LVM #1 to return call to clinic to schedule OV

## 2022-09-16 ENCOUNTER — Other Ambulatory Visit: Payer: Self-pay

## 2022-09-16 ENCOUNTER — Other Ambulatory Visit: Payer: Self-pay | Admitting: Physical Medicine and Rehabilitation

## 2022-09-16 ENCOUNTER — Telehealth: Payer: Self-pay

## 2022-09-16 LAB — LIVER FIBROSIS, FIBROTEST-ACTITEST
ALT: 29 U/L (ref 6–29)
Alpha-2-Macroglobulin: 481 mg/dL — ABNORMAL HIGH (ref 106–279)
Apolipoprotein A1: 238 mg/dL — ABNORMAL HIGH (ref 101–198)
Bilirubin: 0.6 mg/dL (ref 0.2–1.2)
Fibrosis Score: 0.72
GGT: 128 U/L — ABNORMAL HIGH (ref 3–65)
Haptoglobin: 36 mg/dL — ABNORMAL LOW (ref 43–212)
Necroinflammat ACT Score: 0.23
Reference ID: 4688273

## 2022-09-16 LAB — HEPATITIS C RNA QUANTITATIVE
HCV Quantitative Log: 6.89 log IU/mL — ABNORMAL HIGH
HCV RNA, PCR, QN: 7850000 IU/mL — ABNORMAL HIGH

## 2022-09-16 LAB — HEPATITIS C GENOTYPE

## 2022-09-16 LAB — HIV ANTIBODY (ROUTINE TESTING W REFLEX): HIV 1&2 Ab, 4th Generation: NONREACTIVE

## 2022-09-16 NOTE — Telephone Encounter (Signed)
Spoke with patient and she has had MRIs and had a block in the lower back. Scheduled OV for 10/14/21

## 2022-09-16 NOTE — Telephone Encounter (Signed)
-----   Message from Lorine Bears, NP sent at 09/11/2022  9:41 AM EST ----- Not previously seen in our office. When you call patient to schedule please ask her if she has previous lumbar MRI imaging/surgery? Would be helpful for her to bring her records. Thanks ----- Message ----- From: Casilda Carls, CMA Sent: 09/11/2022   9:37 AM EST To: Magnus Sinning, MD; Lorine Bears, NP   ----- Message ----- From: Laurann Montana, RT Sent: 09/11/2022   9:11 AM EST To: Casilda Carls, CMA   ----- Message ----- From: [place] Lions Sent: 09/11/2022   9:05 AM EST To: Laurann Montana, RT  Referral for Dr. Ernestina Patches

## 2022-09-17 ENCOUNTER — Other Ambulatory Visit: Payer: Self-pay

## 2022-09-17 DIAGNOSIS — Z1211 Encounter for screening for malignant neoplasm of colon: Secondary | ICD-10-CM

## 2022-09-17 DIAGNOSIS — K7460 Unspecified cirrhosis of liver: Secondary | ICD-10-CM

## 2022-09-17 MED ORDER — CLENPIQ 10-3.5-12 MG-GM -GM/160ML PO SOLN: 1.0000 | ORAL | 0 refills | Status: DC

## 2022-09-17 NOTE — Telephone Encounter (Signed)
Inbound call from patient stating she needs to schedule for procedures? Please advise.

## 2022-09-17 NOTE — Telephone Encounter (Signed)
Refer to lab result note 09/01/22.

## 2022-09-18 ENCOUNTER — Ambulatory Visit (HOSPITAL_COMMUNITY)
Admission: RE | Admit: 2022-09-18 | Discharge: 2022-09-18 | Disposition: A | Discharge status: Home / Self Care | Payer: Medicaid Other | Source: Ambulatory Visit | Attending: Internal Medicine | Admitting: Internal Medicine

## 2022-09-18 DIAGNOSIS — B1920 Unspecified viral hepatitis C without hepatic coma: Secondary | ICD-10-CM | POA: Insufficient documentation

## 2022-09-24 ENCOUNTER — Other Ambulatory Visit: Payer: Self-pay

## 2022-09-24 ENCOUNTER — Other Ambulatory Visit (HOSPITAL_COMMUNITY): Payer: Self-pay

## 2022-09-24 ENCOUNTER — Telehealth: Payer: Self-pay

## 2022-09-24 ENCOUNTER — Other Ambulatory Visit: Payer: Self-pay | Admitting: Pharmacist

## 2022-09-24 DIAGNOSIS — B1920 Unspecified viral hepatitis C without hepatic coma: Secondary | ICD-10-CM

## 2022-09-24 MED ORDER — MAVYRET 100-40 MG PO TABS
3.0000 | ORAL_TABLET | Freq: Every day | ORAL | 1 refills | Status: DC
  Filled 2022-09-24: qty 84, 28d supply, fill #0
  Filled 2022-09-24: qty 84, fill #0
  Filled 2022-10-17: qty 84, 28d supply, fill #1

## 2022-09-24 NOTE — Telephone Encounter (Signed)
[facility] Patient Advocate Encounter  Mavyret will not need a PA , Medication will have a $4.00 copay.   Prescription can be filled at [facility].  Ileene Patrick, [facility] Patient [facility] Phone: [phone] Fax:  [phone]

## 2022-09-25 ENCOUNTER — Other Ambulatory Visit (HOSPITAL_COMMUNITY): Payer: Self-pay

## 2022-09-25 ENCOUNTER — Other Ambulatory Visit: Payer: Self-pay

## 2022-09-26 ENCOUNTER — Telehealth: Payer: Self-pay

## 2022-09-26 NOTE — Telephone Encounter (Signed)
[facility] Patient Advocate Encounter  Patient's medications have been couriered to [facility] from [facility] and will be picked up .  Cassidy Stevens  Cassidy Stevens , Cassidy Stevens Patient [facility] Phone: [phone] Fax:  [phone]

## 2022-10-09 ENCOUNTER — Other Ambulatory Visit (HOSPITAL_COMMUNITY): Payer: Self-pay

## 2022-10-09 ENCOUNTER — Other Ambulatory Visit: Payer: Self-pay

## 2022-10-13 ENCOUNTER — Ambulatory Visit: Payer: Medicaid Other | Admitting: Physical Medicine and Rehabilitation

## 2022-10-13 ENCOUNTER — Telehealth: Payer: Self-pay | Admitting: Pharmacist

## 2022-10-13 NOTE — Telephone Encounter (Signed)
error 

## 2022-10-14 ENCOUNTER — Ambulatory Visit: Payer: Medicaid Other

## 2022-10-14 ENCOUNTER — Ambulatory Visit: Payer: Medicaid Other | Admitting: Physical Medicine and Rehabilitation

## 2022-10-14 ENCOUNTER — Telehealth: Payer: Self-pay | Admitting: Physical Medicine and Rehabilitation

## 2022-10-14 NOTE — Telephone Encounter (Signed)
Sending back to you for follow up. Tried calling to reschedule, received greeting stating call could not be completed as dialed. Please follow up

## 2022-10-14 NOTE — Telephone Encounter (Signed)
Patient called and LM to cxl her appointment for today. Would like to [facility]

## 2022-10-16 NOTE — Telephone Encounter (Signed)
Tried calling to reschedule, received greeting stating call could not be completed as dialed.

## 2022-10-17 ENCOUNTER — Other Ambulatory Visit (HOSPITAL_COMMUNITY): Payer: Self-pay

## 2022-10-20 ENCOUNTER — Telehealth: Payer: Self-pay

## 2022-10-20 NOTE — Telephone Encounter (Signed)
[facility] Patient Advocate Encounter  Patient's medications have been couriered to [facility] from [facility] and will be picked up .  [place] [facility]  Ileene Patrick , [place] Patient [facility] Phone: [phone] Fax:  [phone]

## 2022-10-24 ENCOUNTER — Other Ambulatory Visit: Payer: Medicaid Other

## 2022-10-24 DIAGNOSIS — K7460 Unspecified cirrhosis of liver: Secondary | ICD-10-CM

## 2022-10-24 NOTE — Progress Notes (Signed)
Encounter opened in error

## 2022-10-27 ENCOUNTER — Encounter: Payer: Self-pay | Admitting: Gastroenterology

## 2022-10-27 ENCOUNTER — Ambulatory Visit (AMBULATORY_SURGERY_CENTER): Payer: Medicaid Other | Admitting: Gastroenterology

## 2022-10-27 VITALS — BP 131/86 | HR 108 | Temp 98.7°F | Resp 22 | Ht 64.0 in | Wt 185.0 lb

## 2022-10-27 DIAGNOSIS — D124 Benign neoplasm of descending colon: Secondary | ICD-10-CM

## 2022-10-27 DIAGNOSIS — D125 Benign neoplasm of sigmoid colon: Secondary | ICD-10-CM

## 2022-10-27 DIAGNOSIS — K7460 Unspecified cirrhosis of liver: Secondary | ICD-10-CM

## 2022-10-27 DIAGNOSIS — D122 Benign neoplasm of ascending colon: Secondary | ICD-10-CM

## 2022-10-27 DIAGNOSIS — Z1211 Encounter for screening for malignant neoplasm of colon: Secondary | ICD-10-CM | POA: Diagnosis not present

## 2022-10-27 DIAGNOSIS — K2970 Gastritis, unspecified, without bleeding: Secondary | ICD-10-CM

## 2022-10-27 DIAGNOSIS — D123 Benign neoplasm of transverse colon: Secondary | ICD-10-CM | POA: Diagnosis not present

## 2022-10-27 DIAGNOSIS — D120 Benign neoplasm of cecum: Secondary | ICD-10-CM | POA: Diagnosis not present

## 2022-10-27 DIAGNOSIS — K641 Second degree hemorrhoids: Secondary | ICD-10-CM

## 2022-10-27 DIAGNOSIS — D128 Benign neoplasm of rectum: Secondary | ICD-10-CM

## 2022-10-27 DIAGNOSIS — Z8719 Personal history of other diseases of the digestive system: Secondary | ICD-10-CM

## 2022-10-27 LAB — AFP TUMOR MARKER: AFP-Tumor Marker: 33.4 ng/mL — ABNORMAL HIGH

## 2022-10-27 MED ORDER — SODIUM CHLORIDE 0.9 % IV SOLN: 500.0000 mL | Freq: Once | INTRAVENOUS | Status: DC

## 2022-10-27 NOTE — Progress Notes (Signed)
Called to room to assist during endoscopic procedure.  Patient ID and intended procedure confirmed with present staff. Received instructions for my participation in the procedure from the performing physician.  

## 2022-10-27 NOTE — Op Note (Signed)
[place] Patient Name: Cassidy Stevens Procedure Date: 10/27/2022 2:41 PM MRN: [id number] Endoscopist: Gerrit Heck , MD, [id number] Age: 62 Referring MD:  Date of Birth: Aug 29, 1961 Gender: Female Account #: [id number] Procedure:                Upper GI endoscopy w/ biopsy Indications:              Abnormal MRI of the GI tract suggestive of                            cirrhosis. EGD today to evaluate for esophageal                            varices and portal hypertensive changes.                            Additionally, evaluation for nausea Medicines:                Monitored Anesthesia Care Procedure:                Pre-Anesthesia Assessment:                           - Prior to the procedure, a History and Physical                            was performed, and patient medications and                            allergies were reviewed. The patient's tolerance of                            previous anesthesia was also reviewed. The risks                            and benefits of the procedure and the sedation                            options and risks were discussed with the patient.                            All questions were answered, and informed consent                            was obtained. Prior Anticoagulants: The patient has                            taken no anticoagulant or antiplatelet agents. ASA                            Grade Assessment: II - A patient with mild systemic                            disease. After reviewing the risks and benefits,  the patient was deemed in satisfactory condition to                            undergo the procedure.                           After obtaining informed consent, the endoscope was                            passed under direct vision. Throughout the                            procedure, the patient's blood pressure, pulse, and                            oxygen saturations were  monitored continuously. The                            GIF [id number] #[id number] was introduced through the                            mouth, and advanced to the third part of duodenum.                            The upper GI endoscopy was accomplished without                            difficulty. The patient tolerated the procedure                            well. Scope In: Scope Out: Findings:                 The examined esophagus was normal.                           The Z-line was regular and was found 38 cm from the                            incisors.                           Mild inflammation characterized by congestion                            (edema) and erythema was found in the entire                            examined stomach. Biopsies were taken with a cold                            forceps for histology. Estimated blood loss was                            minimal.  The examined duodenum was normal. Complications:            No immediate complications. Estimated Blood Loss:     Estimated blood loss was minimal. Impression:               - Normal esophagus.                           - Z-line regular, 38 cm from the incisors.                           - Non-ulcer gastritis. Biopsied.                           - Normal examined duodenum. Recommendation:           - Patient has a contact number available for                            emergencies. The signs and symptoms of potential                            delayed complications were discussed with the                            patient. Return to normal activities tomorrow.                            Written discharge instructions were provided to the                            patient.                           - Resume previous diet.                           - Continue present medications.                           - Await pathology results. Gerrit Heck, MD 10/27/2022 3:31:43 PM

## 2022-10-27 NOTE — Op Note (Signed)
[place] Patient Name: Cassidy Stevens Procedure Date: 10/27/2022 2:41 PM MRN: [id number] Endoscopist: Gerrit Heck , MD, [id number] Age: 62 Referring MD:  Date of Birth: [birth year] Gender: Female Account #: [id number] Procedure:                Colonoscopy w/ snare polypectomy Indications:              Screening for colorectal malignant neoplasm. Last                            colonoscopy was >5 years ago at outside facility                            and notable for inadequate bowel preparation.                           Separately, history of intermittent scant BRBPR                            during episodes of constipation/hard stools. Medicines:                Monitored Anesthesia Care Procedure:                Pre-Anesthesia Assessment:                           - Prior to the procedure, a History and Physical                            was performed, and patient medications and                            allergies were reviewed. The patient's tolerance of                            previous anesthesia was also reviewed. The risks                            and benefits of the procedure and the sedation                            options and risks were discussed with the patient.                            All questions were answered, and informed consent                            was obtained. Prior Anticoagulants: The patient has                            taken no anticoagulant or antiplatelet agents. ASA                            Grade Assessment: II - A patient with mild systemic  disease. After reviewing the risks and benefits,                            the patient was deemed in satisfactory condition to                            undergo the procedure.                           After obtaining informed consent, the colonoscope                            was passed under direct vision. Throughout the                             procedure, the patient's blood pressure, pulse, and                            oxygen saturations were monitored continuously. The                            Colonoscope was introduced through the anus and                            advanced to the the terminal ileum. The colonoscopy                            was performed without difficulty. The patient                            tolerated the procedure well. The quality of the                            bowel preparation was good. The terminal ileum,                            ileocecal valve, appendiceal orifice, and rectum                            were photographed. Scope In: 2:53:14 PM Scope Out: 3:25:05 PM Scope Withdrawal Time: 0 hours 28 minutes 49 seconds  Total Procedure Duration: 0 hours 31 minutes 51 seconds  Findings:                 Skin tags were found on perianal exam.                           Eight sessile polyps were found in the transverse                            colon (2), ascending colon (4), and cecum (2). The                            polyps were 2 to 6 mm in size. These polyps were  removed with a cold snare. Resection and retrieval                            were complete. Estimated blood loss was minimal.                           Five sessile polyps were found in the descending                            colon. The polyps were 3 to 6 mm in size. These                            polyps were removed with a cold snare. Resection                            and retrieval were complete. Estimated blood loss                            was minimal.                           Three sessile polyps were found in the sigmoid                            colon. The polyps were 2 to 5 mm in size. These                            polyps were removed with a cold snare. Resection                            and retrieval were complete. Estimated blood loss                            was minimal.                            Eight sessile polyps were found in the rectum. The                            polyps were 1 to 3 mm in size. These polyps were                            removed with a cold snare. Resection and retrieval                            were complete. Estimated blood loss was minimal.                           Non-bleeding internal hemorrhoids were found during                            retroflexion. The hemorrhoids were small.  The terminal ileum appeared normal. Complications:            No immediate complications. Estimated Blood Loss:     Estimated blood loss was minimal. Impression:               - Perianal skin tags found on perianal exam.                           - Eight 2 to 6 mm polyps in the transverse colon,                            in the ascending colon and in the cecum, removed                            with a cold snare. Resected and retrieved.                           - Five 3 to 6 mm polyps in the descending colon,                            removed with a cold snare. Resected and retrieved.                           - Three 2 to 5 mm polyps in the sigmoid colon,                            removed with a cold snare. Resected and retrieved.                           - Eight 1 to 3 mm polyps in the rectum, removed                            with a cold snare. Resected and retrieved.                           - Non-bleeding internal hemorrhoids.                           - The examined portion of the ileum was normal. Recommendation:           - Patient has a contact number available for                            emergencies. The signs and symptoms of potential                            delayed complications were discussed with the                            patient. Return to normal activities tomorrow.                            Written discharge instructions were provided to the  patient.                            - Resume previous diet.                           - Continue present medications.                           - Await pathology results.                           - Repeat colonoscopy for surveillance based on                            pathology results. Gerrit Heck, MD 10/27/2022 3:40:02 PM

## 2022-10-27 NOTE — Progress Notes (Signed)
GASTROENTEROLOGY PROCEDURE H&P NOTE   Primary Care Physician: Terald Sleeper, PA-C    Reason for Procedure:   Nausea/vomiting, EV screening, Colon cancer screening, BRBPR  Plan:    EGD, colonoscopy  Patient is appropriate for endoscopic procedure(s) in the ambulatory ([facility]) setting.  The nature of the procedure, as well as the risks, benefits, and alternatives were carefully and thoroughly reviewed with the patient. Ample time for discussion and questions allowed. The patient understood, was satisfied, and agreed to proceed.     HPI: Cassidy Stevens is a 62 y.o. female who presents for EGD and colonoscopy for evaluation of n/v and intrmittent scant BRBPR, along with EV screening and CRC screening. She was last seen in the GI clinic on 09/21/2022. No interval change in GI clinical history since that appt.   Past Medical History:  Diagnosis Date   Fibromyalgia     Past Surgical History:  Procedure Laterality Date   ABDOMINAL HYSTERECTOMY     FACIAL RECONSTRUCTION SURGERY     KNEE SURGERY      Prior to Admission medications   Medication Sig Start Date End Date Taking? Authorizing Provider  famotidine (PEPCID) 20 MG tablet Take 1 tablet (20 mg total) by mouth at bedtime. 09/01/22  Yes Noralyn Pick, NP  gabapentin (NEURONTIN) 600 MG tablet  01/10/21  Yes [provider]  ondansetron (ZOFRAN) 4 MG tablet Take 1 tablet (4 mg total) by mouth every 8 (eight) hours as needed for nausea or vomiting. 09/01/22  Yes Noralyn Pick, NP  albuterol (VENTOLIN HFA) 108 (90 Base) MCG/ACT inhaler  05/16/21   [provider]  clobetasol ointment (TEMOVATE) 0.05 %  03/29/21   [provider]  fluticasone-salmeterol (ADVAIR) 100-50 MCG/ACT AEPB Inhale 1 puff into the lungs 2 (two) times daily.    [provider]  Fluticasone-Umeclidin-Vilant (TRELEGY ELLIPTA) 200-62.5-25 MCG/INH AEPB Inhale 1 puff into the lungs daily. Patient not taking:  Reported on 09/08/2022 05/07/21   Hunsucker, Bonna Gains, MD  gabapentin (NEURONTIN) 300 MG capsule Take 1 capsule (300 mg total) by mouth 3 (three) times daily. 01/10/21   Rancour, Annie Main, MD  Glecaprevir-Pibrentasvir (MAVYRET) 100-40 MG TABS Take 3 tablets by mouth daily with breakfast. 09/24/22   Kuppelweiser, Cassie L, RPH-CPP  ibuprofen (ADVIL) 600 MG tablet  02/13/21   [provider]  naproxen (NAPROSYN) 500 MG tablet  01/24/21   [provider]  rosuvastatin (CRESTOR) 10 MG tablet Take 1 tablet every day by oral route. Patient not taking: Reported on 10/27/2022    [provider]  SSD 1 % cream APPLY TWICE DAILY ON AFFECTED AREAS 01/16/22   [provider]  SUMAtriptan (IMITREX) 50 MG tablet  01/24/21   [provider]    Current Outpatient Medications  Medication Sig Dispense Refill   famotidine (PEPCID) 20 MG tablet Take 1 tablet (20 mg total) by mouth at bedtime. 30 tablet 1   gabapentin (NEURONTIN) 600 MG tablet      ondansetron (ZOFRAN) 4 MG tablet Take 1 tablet (4 mg total) by mouth every 8 (eight) hours as needed for nausea or vomiting. 30 tablet 0   albuterol (VENTOLIN HFA) 108 (90 Base) MCG/ACT inhaler      clobetasol ointment (TEMOVATE) 0.05 %      fluticasone-salmeterol (ADVAIR) 100-50 MCG/ACT AEPB Inhale 1 puff into the lungs 2 (two) times daily.     Fluticasone-Umeclidin-Vilant (TRELEGY ELLIPTA) 200-62.5-25 MCG/INH AEPB Inhale 1 puff into the lungs daily. (Patient not  taking: Reported on 09/08/2022) 60 each 11   gabapentin (NEURONTIN) 300 MG capsule Take 1 capsule (300 mg total) by mouth 3 (three) times daily. 30 capsule 0   Glecaprevir-Pibrentasvir (MAVYRET) 100-40 MG TABS Take 3 tablets by mouth daily with breakfast. 84 tablet 1   ibuprofen (ADVIL) 600 MG tablet      naproxen (NAPROSYN) 500 MG tablet      rosuvastatin (CRESTOR) 10 MG tablet Take 1 tablet every day by oral route. (Patient not taking: Reported on 10/27/2022)     SSD 1 %  cream APPLY TWICE DAILY ON AFFECTED AREAS     SUMAtriptan (IMITREX) 50 MG tablet      Current Facility-Administered Medications  Medication Dose Route Frequency Provider Last Rate Last Admin   0.9 %  sodium chloride infusion  500 mL Intravenous Once Keiera Strathman V, DO        Allergies as of 10/27/2022   (No Known Allergies)    Family History  Problem Relation Age of Onset   Neuropathy Maternal Aunt    Neuropathy Maternal Uncle     Social History   Socioeconomic History   Marital status: Divorced    Spouse name: Not on file   Number of children: 3   Years of education: Not on file   Highest education level: Not on file  Occupational History   Occupation: disabled  Tobacco Use   Smoking status: Every Day    Packs/day: 2.00    Types: Cigarettes   Smokeless tobacco: Never   Tobacco comments:    Working on quitting  Substance and Sexual Activity   Alcohol use: Not Currently   Drug use: Never   Sexual activity: Not on file  Other Topics Concern   Not on file  Social History Narrative   Not on file   Social Determinants of Health   Financial Resource Strain: Not on file  Food Insecurity: Not on file  Transportation Needs: Not on file  Physical Activity: Not on file  Stress: Not on file  Social Connections: Not on file  Intimate Partner Violence: Not on file    Physical Exam: Vital signs in last 24 hours: '@BP'$  (!) 140/82   Pulse 93   Temp 98.7 F (37.1 C)   Ht '5\' 4"'$  (1.626 m)   Wt 185 lb (83.9 kg)   SpO2 96%   BMI 31.76 kg/m  GEN: NAD EYE: Sclerae anicteric ENT: MMM CV: Non-tachycardic Pulm: CTA b/l GI: Soft, NT/ND NEURO:  Alert & Oriented x Sterling, DO [facility]   10/27/2022 2:33 PM

## 2022-10-27 NOTE — Patient Instructions (Signed)
Resume previous diet and medications. Awaiting pathology results. Repeat Colonoscopy date to be determined based on pathology. Handouts given on Colon polyps, Hemorrhoids and Gastritis   YOU HAD AN ENDOSCOPIC PROCEDURE TODAY AT [facility]:   Refer to the procedure report that was given to you for any specific questions about what was found during the examination.  If the procedure report does not answer your questions, please call your gastroenterologist to clarify.  If you requested that your care partner not be given the details of your procedure findings, then the procedure report has been included in a sealed envelope for you to review at your convenience later.  YOU SHOULD EXPECT: Some feelings of bloating in the abdomen. Passage of more gas than usual.  Walking can help get rid of the air that was put into your GI tract during the procedure and reduce the bloating. If you had a lower endoscopy (such as a colonoscopy or flexible sigmoidoscopy) you may notice spotting of blood in your stool or on the toilet paper. If you underwent a bowel prep for your procedure, you may not have a normal bowel movement for a few days.  Please Note:  You might notice some irritation and congestion in your nose or some drainage.  This is from the oxygen used during your procedure.  There is no need for concern and it should clear up in a day or so.  SYMPTOMS TO REPORT IMMEDIATELY:  Following lower endoscopy (colonoscopy or flexible sigmoidoscopy):  Excessive amounts of blood in the stool  Significant tenderness or worsening of abdominal pains  Swelling of the abdomen that is new, acute  Fever of 100F or higher  Following upper endoscopy (EGD)  Vomiting of blood or coffee ground material  New chest pain or pain under the shoulder blades  Painful or persistently difficult swallowing  New shortness of breath  Fever of 100F or higher  Black, tarry-looking stools  For urgent or emergent  issues, a gastroenterologist can be reached at any hour by calling [phone]. Do not use MyChart messaging for urgent concerns.    DIET:  We do recommend a small meal at first, but then you may proceed to your regular diet.  Drink plenty of fluids but you should avoid alcoholic beverages for 24 hours.  ACTIVITY:  You should plan to take it easy for the rest of today and you should NOT DRIVE or use heavy machinery until tomorrow (because of the sedation medicines used during the test).    FOLLOW UP: Our staff will call the number listed on your records the next business day following your procedure.  We will call around 7:15- 8:00 am to check on you and address any questions or concerns that you may have regarding the information given to you following your procedure. If we do not reach you, we will leave a message.     If any biopsies were taken you will be contacted by phone or by letter within the next 1-3 weeks.  Please call us at [phone] if you have not heard about the biopsies in 3 weeks.    SIGNATURES/CONFIDENTIALITY: You and/or your care partner have signed paperwork which will be entered into your electronic medical record.  These signatures attest to the fact that that the information above on your After Visit Summary has been reviewed and is understood.  Full responsibility of the confidentiality of this discharge information lies with you and/or your care-partner.

## 2022-10-27 NOTE — Progress Notes (Signed)
Pt's states no medical or surgical changes since previsit or office visit. 

## 2022-10-27 NOTE — Progress Notes (Signed)
Pt resting comfortably. VSS. Airway intact. SBAR complete to RN. All questions answered.   

## 2022-10-28 ENCOUNTER — Telehealth: Payer: Self-pay

## 2022-10-28 NOTE — Telephone Encounter (Signed)
  Follow up Call-     10/27/2022    1:29 PM  Call back number  Post procedure Call Back phone  # [phone]  Permission to leave phone message Yes     Patient questions:  Do you have a fever, pain , or abdominal swelling? No. Pain Score  0 *  Have you tolerated food without any problems? Yes.    Have you been able to return to your normal activities? Yes.    Do you have any questions about your discharge instructions: Diet   No. Medications  No. Follow up visit  No.  Do you have questions or concerns about your Care? No.  Actions: * If pain score is 4 or above: No action needed, pain <4.

## 2022-10-29 ENCOUNTER — Telehealth: Payer: Self-pay | Admitting: Nurse Practitioner

## 2022-10-29 ENCOUNTER — Other Ambulatory Visit: Payer: Self-pay | Admitting: Nurse Practitioner

## 2022-10-29 NOTE — Telephone Encounter (Signed)
Inbound call from patient requesting refill for Zofran. Please advise.

## 2022-10-29 NOTE — Telephone Encounter (Signed)
Please advise 

## 2022-10-31 ENCOUNTER — Encounter: Payer: Self-pay | Admitting: Gastroenterology

## 2022-11-06 ENCOUNTER — Ambulatory Visit (INDEPENDENT_AMBULATORY_CARE_PROVIDER_SITE_OTHER): Payer: Medicaid Other | Admitting: Pharmacist

## 2022-11-06 ENCOUNTER — Ambulatory Visit: Payer: Medicaid Other | Admitting: Pharmacist

## 2022-11-06 ENCOUNTER — Other Ambulatory Visit: Payer: Self-pay

## 2022-11-06 DIAGNOSIS — B1920 Unspecified viral hepatitis C without hepatic coma: Secondary | ICD-10-CM | POA: Diagnosis not present

## 2022-11-06 NOTE — Progress Notes (Addendum)
11/06/2022  HPI: Cassidy Stevens is a 62 y.o. female who presents to the [facility] for Hepatitis C follow-up.  Medication: Mavyret  Start Date: 10/06/22  Hepatitis C Genotype: 1a  Fibrosis Score: F3  Hepatitis C RNA: 7,850,000 prior to treatment  Patient Active Problem List   Diagnosis Date Noted   Pain in both lower extremities 11/27/2021   Chronic midline low back pain without sciatica 11/27/2021   Chronic neck pain 11/27/2021    Patient's Medications  New Prescriptions   No medications on file  Previous Medications   ALBUTEROL (VENTOLIN HFA) 108 (90 BASE) MCG/ACT INHALER       CLOBETASOL OINTMENT (TEMOVATE) 0.05 %       FAMOTIDINE (PEPCID) 20 MG TABLET    Take 1 tablet (20 mg total) by mouth at bedtime.   FLUTICASONE-SALMETEROL (ADVAIR) 100-50 MCG/ACT AEPB    Inhale 1 puff into the lungs 2 (two) times daily.   FLUTICASONE-UMECLIDIN-VILANT (TRELEGY ELLIPTA) 200-62.5-25 MCG/INH AEPB    Inhale 1 puff into the lungs daily.   GABAPENTIN (NEURONTIN) 300 MG CAPSULE    Take 1 capsule (300 mg total) by mouth 3 (three) times daily.   GABAPENTIN (NEURONTIN) 600 MG TABLET       GLECAPREVIR-PIBRENTASVIR (MAVYRET) 100-40 MG TABS    Take 3 tablets by mouth daily with breakfast.   IBUPROFEN (ADVIL) 600 MG TABLET       NAPROXEN (NAPROSYN) 500 MG TABLET       ONDANSETRON (ZOFRAN) 4 MG TABLET    TAKE 1 TABLET BY MOUTH EVERY 8 HOURS AS NEEDED FOR NAUSEA OR VOMITING   ROSUVASTATIN (CRESTOR) 10 MG TABLET    Take 1 tablet every day by oral route.   SSD 1 % CREAM    APPLY TWICE DAILY ON AFFECTED AREAS   SUMATRIPTAN (IMITREX) 50 MG TABLET      Modified Medications   No medications on file  Discontinued Medications   No medications on file    Allergies: No Known Allergies  Past Medical History: Past Medical History:  Diagnosis Date   Fibromyalgia     Social History: Social History   Socioeconomic History   Marital status: Divorced    Spouse name: Not on file    Number of children: 3   Years of education: Not on file   Highest education level: Not on file  Occupational History   Occupation: disabled  Tobacco Use   Smoking status: Every Day    Packs/day: 2.00    Types: Cigarettes   Smokeless tobacco: Never   Tobacco comments:    Working on quitting  Substance and Sexual Activity   Alcohol use: Not Currently   Drug use: Never   Sexual activity: Not on file  Other Topics Concern   Not on file  Social History Narrative   Not on file   Social Determinants of Health   Financial Resource Strain: Not on file  Food Insecurity: Not on file  Transportation Needs: Not on file  Physical Activity: Not on file  Stress: Not on file  Social Connections: Not on file    Labs: Hepatitis C Lab Results  Component Value Date   HCVGENOTYPE 1a 09/08/2022   HEPCAB REACTIVE (A) 09/01/2022   HCVRNAPCRQN 7,850,000 (H) 09/08/2022   HCVRNAPCRQN 3,290,000 (H) 09/01/2022   FIBROSTAGE F3 09/08/2022   Hepatitis B Lab Results  Component Value Date   HEPBSAB NON-REACTIVE 09/01/2022   HEPBSAG NON-REACTIVE 09/01/2022   HEPBCAB NON-REACTIVE 09/08/2022  Hepatitis A Lab Results  Component Value Date   HAV REACTIVE (A) 09/01/2022   HIV Lab Results  Component Value Date   HIV NON-REACTIVE 09/08/2022   Lab Results  Component Value Date   CREATININE 0.49 09/01/2022   Lab Results  Component Value Date   AST 40 (H) 09/01/2022   ALT 29 09/08/2022   ALT 40 (H) 09/01/2022   INR 1.0 09/01/2022    Assessment: Deniyah presents today for 4-week follow up after starting Mavyret x 8 weeks for treatment of HCV. She reports starting the medication on 10/06/22 after returning from a trip to [place]. She notes nauseous with the medication that is relieved with Zofran and when she takes at night after dinner. She notes missing 7 days earlier this month due to nausea while she was taking antibiotics for a sinus infection. She also noted missing her dose last night but  took it when she remembered this morning.   Explained that missing doses reduces the chance of cure and advised on the importance of taking her medications. Emphasized that she needs to avoid missing any additional doses.    Plan: - Continue Mavyret, gave 4 week supply - Collect HCV RNA, CMP today - Follow up in 4 weeks with Dr. Allene Dillon, PharmD PGY1 Pharmacy Resident 11/06/2022 1:37 PM

## 2022-11-08 LAB — COMPREHENSIVE METABOLIC PANEL
AG Ratio: 1.5 (calc) (ref 1.0–2.5)
ALT: 11 U/L (ref 6–29)
AST: 14 U/L (ref 10–35)
Albumin: 4.2 g/dL (ref 3.6–5.1)
Alkaline phosphatase (APISO): 78 U/L (ref 37–153)
BUN: 9 mg/dL (ref 7–25)
CO2: 25 mmol/L (ref 20–32)
Calcium: 8.9 mg/dL (ref 8.6–10.4)
Chloride: 97 mmol/L — ABNORMAL LOW (ref 98–110)
Creat: 0.54 mg/dL (ref 0.50–1.05)
Globulin: 2.8 g/dL (calc) (ref 1.9–3.7)
Glucose, Bld: 90 mg/dL (ref 65–99)
Potassium: 4 mmol/L (ref 3.5–5.3)
Sodium: 133 mmol/L — ABNORMAL LOW (ref 135–146)
Total Bilirubin: 0.4 mg/dL (ref 0.2–1.2)
Total Protein: 7 g/dL (ref 6.1–8.1)

## 2022-11-08 LAB — HEPATITIS C RNA QUANTITATIVE
HCV Quantitative Log: 1.76 log IU/mL — ABNORMAL HIGH
HCV RNA, PCR, QN: 57 IU/mL — ABNORMAL HIGH

## 2022-11-13 ENCOUNTER — Other Ambulatory Visit: Payer: Self-pay | Admitting: Nurse Practitioner

## 2022-11-14 NOTE — Telephone Encounter (Signed)
Please advise 

## 2022-12-10 ENCOUNTER — Ambulatory Visit: Payer: Medicaid Other | Admitting: Internal Medicine

## 2022-12-10 ENCOUNTER — Other Ambulatory Visit: Payer: Self-pay

## 2022-12-10 VITALS — BP 110/74 | HR 89 | Temp 98.3°F | Wt 185.2 lb

## 2022-12-10 DIAGNOSIS — B1920 Unspecified viral hepatitis C without hepatic coma: Secondary | ICD-10-CM

## 2022-12-10 NOTE — Progress Notes (Signed)
[facility]                                      [address], [place], [place], [address]                                               Phn. [phone]; Fax: [phone]                                                               Date:  Reason for Visit: Hepatitis C    HPI: Cassidy Stevens is a 62 y.o.old female with Chronic hep C, depression, PTSD, alcohol use disorder abstinent x 11 years, remote history of IVDA, last use 23 years ago, migraine headaches, coronary artery disease status post MI, COPD, gallstones referred from GI for management of hepatitis C.  Noted that she was diagnosed with hep C about 30 years ago.  Per GIs note there was a CT on 06/24/2022 showed a slightly lobular hepatic contour is without focal liver lesion, cyst with stable renal shunting suggestive of portal hypertension and mildly distended gallbladder, with which was concerning for cirrhosis.  Viral load at GI office was 3.29 million.  MRI abdominal ordered and plan for EGD if MRI or identified cirrhosis. Pt believes she contrived HCV form husband.  Daughter on the phone during the visit.  Today 12/10/22: No new complaints. Taking mavyret about 8-9 days left.     Denies Hx of blood transfusion,  [employer] logistics/support/administrative officer.  Nofamily history of liver disease, Hepatitis or Liver cancer.  He has not received treatment to date     Denies any hospitalizations related to liver disease, jaundice, ascites, GI bleeding, mental status changes, abdominal pain and acholic stool.    ROS: Denies yellowish discoloration of sclera and skin, abdominal pain/distension, hematemesis.  Denis cough, fever, chills, nightsweats, nausea, vomiting, diarrhea, constipation, weight loss, recent hospitalizations, rashes, joint complaints, shortness of breath, chest pain, headaches, dysuria .   Current Outpatient Medications on File Prior to Visit  Medication Sig Dispense  Refill   albuterol (VENTOLIN HFA) 108 (90 Base) MCG/ACT inhaler      clobetasol ointment (TEMOVATE) 0.05 %      famotidine (PEPCID) 20 MG tablet Take 1 tablet (20 mg total) by mouth at bedtime. 30 tablet 1   fluticasone-salmeterol (ADVAIR) 100-50 MCG/ACT AEPB Inhale 1 puff into the lungs 2 (two) times daily.     Fluticasone-Umeclidin-Vilant (TRELEGY ELLIPTA) 200-62.5-25 MCG/INH AEPB Inhale 1 puff into the lungs daily. (Patient not taking: Reported on 09/08/2022) 60 each 11   gabapentin (NEURONTIN) 300 MG capsule Take 1 capsule (300 mg total) by mouth 3 (three) times daily. 30 capsule 0   gabapentin (NEURONTIN) 600 MG tablet      Glecaprevir-Pibrentasvir (MAVYRET) 100-40 MG TABS Take 3 tablets by mouth daily with breakfast. 84 tablet 1   ibuprofen (ADVIL) 600 MG tablet      naproxen (NAPROSYN) 500 MG tablet      ondansetron (ZOFRAN) 4 MG tablet TAKE 1 TABLET BY MOUTH EVERY  8 HOURS AS NEEDED FOR NAUSEA OR VOMITING 30 tablet 0   rosuvastatin (CRESTOR) 10 MG tablet Take 1 tablet every day by oral route. (Patient not taking: Reported on 10/27/2022)     SSD 1 % cream APPLY TWICE DAILY ON AFFECTED AREAS     SUMAtriptan (IMITREX) 50 MG tablet      No current facility-administered medications on file prior to visit.   No Known Allergies  Past Medical History:  Diagnosis Date   Fibromyalgia     Past Surgical History:  Procedure Laterality Date   ABDOMINAL HYSTERECTOMY     FACIAL RECONSTRUCTION SURGERY     KNEE SURGERY      Social History   Socioeconomic History   Marital status: Divorced    Spouse name: Not on file   Number of children: 3   Years of education: Not on file   Highest education level: Not on file  Occupational History   Occupation: disabled  Tobacco Use   Smoking status: Every Day    Packs/day: 2.00    Types: Cigarettes   Smokeless tobacco: Never   Tobacco comments:    Working on quitting  Substance and Sexual Activity   Alcohol use: Not Currently   Drug use:  Never   Sexual activity: Not on file  Other Topics Concern   Not on file  Social History Narrative   Not on file   Social Determinants of Health   Financial Resource Strain: Not on file  Food Insecurity: Not on file  Transportation Needs: Not on file  Physical Activity: Not on file  Stress: Not on file  Social Connections: Not on file  Intimate Partner Violence: Not on file    Family History  Problem Relation Age of Onset   Neuropathy Maternal Aunt    Neuropathy Maternal Uncle     Physical exam: There were no vitals taken for this visit.  Gen: Alert and oriented x 3, no acute distress HEENT: [place]/AT, PERL, EOMI, no scleral icterus, no pale conjunctivae, hearing normal, oral mucosa moist Neck: Supple, no lymphadenopathy Cardio: Regular rate and rhythm; +S1 and S2; no murmurs, gallops, or rubs Resp: CTAB; no wheezes, rhonchi, or rales GI: Soft, nontender, nondistended, bowel sounds present GU: Musc: Extremities: No cyanosis, clubbing, or edema; +2 PT and DP pulses Skin: No rashes, lesions, or ecchymoses Neuro: No focal deficits Psych: Calm, cooperative     Assessment/Plan: #Chronic Hepatitis C with VL on 12/4 3.29 M #History of EtOH abuse-11 years ago with last drink #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 - Declined  HBV immunizaiton - Labs in 10 days once done done with Mavyrett. F/U in 3 months for SVR -U/S in 6 months, pt is going to see liver specialist in April      #Gallstones with nausea - No plans for [facility] at this time - EGD at time of colonoscopy di not note varices, non-ulcer gastritis   #Infrequent rectal bleeding - MRI suggestive of cirrhosis, no supicious hepatic lesion, portal venous HTN, distended GB.  - Colonoscopy with multiple polyps(no malignancy noted on path)     Counseling  done on the following -Natural progression of hep c, transmission (avoid sharing personal hygiene equipment), prevention, risks of left untreated and treatment options  -Avoid hepatotoxins like alcohol and excessive acetamaminphen (no  more than 2 gram a day) -Avoid eating raw sea food -Risks of re-infection  -Hepatitis coinfection and vaccination( Pneumococcal vaccination in the cirrhotics    Electronically signed by:  Laurice Record, MD Infectious Diseases  Fax no. [phone]   I have personally spent 34 minutes involved in face-to-face and non-face-to-face activities for this patient on the day of the visit. Professional time spent includes the following activities: Preparing to see the patient (review of tests), Obtaining and/or reviewing separately obtained history (admission/discharge record), Performing a medically appropriate examination and/or evaluation , Ordering medications/tests/procedures, referring and communicating with other health care professionals, Documenting clinical information in the EMR, Independently interpreting results (not separately reported), Communicating results to the patient/family/caregiver, Counseling and educating the patient/family/caregiver and Care coordination (not separately reported).

## 2022-12-22 ENCOUNTER — Other Ambulatory Visit: Payer: Self-pay

## 2022-12-22 ENCOUNTER — Other Ambulatory Visit: Payer: Medicaid Other

## 2022-12-22 DIAGNOSIS — B1920 Unspecified viral hepatitis C without hepatic coma: Secondary | ICD-10-CM

## 2022-12-22 NOTE — Addendum Note (Signed)
Addended by: Caffie Pinto on: 12/22/2022 08:31 AM   Modules accepted: Orders

## 2022-12-24 LAB — CBC WITH DIFFERENTIAL/PLATELET
Absolute Monocytes: 576 cells/uL (ref 200–950)
Basophils Absolute: 56 cells/uL (ref 0–200)
Basophils Relative: 0.7 %
Eosinophils Absolute: 8 cells/uL — ABNORMAL LOW (ref 15–500)
Eosinophils Relative: 0.1 %
HCT: 42.6 % (ref 35.0–45.0)
Hemoglobin: 15.2 g/dL (ref 11.7–15.5)
Lymphs Abs: 3248 cells/uL (ref 850–3900)
MCH: 32.1 pg (ref 27.0–33.0)
MCHC: 35.7 g/dL (ref 32.0–36.0)
MCV: 90.1 fL (ref 80.0–100.0)
MPV: 10.4 fL (ref 7.5–12.5)
Monocytes Relative: 7.2 %
Neutro Abs: 4112 cells/uL (ref 1500–7800)
Neutrophils Relative %: 51.4 %
Platelets: 212 10*3/uL (ref 140–400)
RBC: 4.73 10*6/uL (ref 3.80–5.10)
RDW: 11.8 % (ref 11.0–15.0)
Total Lymphocyte: 40.6 %
WBC: 8 10*3/uL (ref 3.8–10.8)

## 2022-12-24 LAB — COMPLETE METABOLIC PANEL WITH GFR
AG Ratio: 1.8 (calc) (ref 1.0–2.5)
ALT: 14 U/L (ref 6–29)
AST: 16 U/L (ref 10–35)
Albumin: 4.4 g/dL (ref 3.6–5.1)
Alkaline phosphatase (APISO): 89 U/L (ref 37–153)
BUN: 7 mg/dL (ref 7–25)
CO2: 24 mmol/L (ref 20–32)
Calcium: 8.9 mg/dL (ref 8.6–10.4)
Chloride: 100 mmol/L (ref 98–110)
Creat: 0.52 mg/dL (ref 0.50–1.05)
Globulin: 2.5 g/dL (calc) (ref 1.9–3.7)
Glucose, Bld: 119 mg/dL — ABNORMAL HIGH (ref 65–99)
Potassium: 4.1 mmol/L (ref 3.5–5.3)
Sodium: 134 mmol/L — ABNORMAL LOW (ref 135–146)
Total Bilirubin: 0.4 mg/dL (ref 0.2–1.2)
Total Protein: 6.9 g/dL (ref 6.1–8.1)
eGFR: 106 mL/min/{1.73_m2} (ref >=60)

## 2022-12-24 LAB — HEPATITIS C RNA QUANTITATIVE
HCV Quantitative Log: <1.18 log IU/mL
HCV RNA, PCR, QN: <15 IU/mL

## 2023-01-07 ENCOUNTER — Other Ambulatory Visit: Payer: Self-pay | Admitting: Nurse Practitioner

## 2023-01-07 DIAGNOSIS — K7469 Other cirrhosis of liver: Secondary | ICD-10-CM

## 2023-01-07 DIAGNOSIS — B182 Chronic viral hepatitis C: Secondary | ICD-10-CM

## 2023-01-30 ENCOUNTER — Ambulatory Visit
Admission: RE | Admit: 2023-01-30 | Discharge: 2023-01-30 | Disposition: A | Discharge status: Home / Self Care | Payer: Medicaid Other | Source: Ambulatory Visit | Attending: Nurse Practitioner | Admitting: Nurse Practitioner

## 2023-01-30 DIAGNOSIS — K7469 Other cirrhosis of liver: Secondary | ICD-10-CM

## 2023-01-30 DIAGNOSIS — B182 Chronic viral hepatitis C: Secondary | ICD-10-CM

## 2023-02-21 ENCOUNTER — Emergency Department (HOSPITAL_BASED_OUTPATIENT_CLINIC_OR_DEPARTMENT_OTHER)
Admission: EM | Admit: 2023-02-21 | Discharge: 2023-02-21 | Disposition: A | Discharge status: Home / Self Care | Payer: Medicaid Other | Attending: Emergency Medicine | Admitting: Emergency Medicine

## 2023-02-21 ENCOUNTER — Encounter (HOSPITAL_BASED_OUTPATIENT_CLINIC_OR_DEPARTMENT_OTHER): Payer: Self-pay | Admitting: Emergency Medicine

## 2023-02-21 DIAGNOSIS — M79605 Pain in left leg: Secondary | ICD-10-CM | POA: Insufficient documentation

## 2023-02-21 HISTORY — DX: Unspecified cirrhosis of liver: K74.60

## 2023-02-21 HISTORY — DX: Chronic obstructive pulmonary disease, unspecified: J44.9

## 2023-02-21 MED ORDER — BUTALBITAL-APAP-CAFFEINE 50-325-40 MG PO TABS: 1.0000 | ORAL_TABLET | Freq: Every day | ORAL | 0 refills | Status: DC | PRN

## 2023-02-21 MED ORDER — METHOCARBAMOL 500 MG PO TABS
500.0000 mg | ORAL_TABLET | Freq: Once | ORAL | Status: AC
  Administered 2023-02-21: 500 mg via ORAL
  Filled 2023-02-21: qty 1

## 2023-02-21 MED ORDER — KETOROLAC TROMETHAMINE 15 MG/ML IJ SOLN
15.0000 mg | Freq: Once | INTRAMUSCULAR | Status: AC
  Administered 2023-02-21: 15 mg via INTRAMUSCULAR
  Filled 2023-02-21: qty 1

## 2023-02-21 NOTE — ED Provider Notes (Signed)
[place] [facility]ENCY DE[facility]MENT AT [facility] Provider Note   CSN: [id number] Arrival date & time: 02/21/23  1124     History  Chief Complaint  Patient presents with   Leg Pain    Cassidy Stevens is a 62 y.o. female with a PMHx of fibromyalgia, sciatica, neuropathy, who presents to the ED with concerns for left leg pain x 2 days. She thinks that she pulled a muscle in her groin. Notes that Denies recent injury, trauma, fall, recent increase in walking, or exercise. Denies anticoagulant use. Denies history of PE/DVT.    Per pt chart review: Pt has been evaluated by different neurology on 11/27/2021 for bilateral lower extremity pain, left greater than right.  At that time patient noted that her legs were aching.  At that time, patient had a nonfocal neurologic exam, due to patient being followed by [facility] orthopedics and getting injections into her neck as well as having her joint pain and back pain addressed with [facility], though provider [facility] had low suspicion at that time for polyneuropathy even with the patient's history of alcoholism, recommended the patient follow-up with [facility].  The history is provided by the patient. No language interpreter was used.       Home Medications Prior to Admission medications   Medication Sig Start Date End Date Taking? Authorizing Provider  butalbital-acetaminophen-caffeine (FIORICET) 50-325-40 MG tablet Take 1 tablet by mouth daily as needed for headache. 02/21/23  Yes Lorin Hauck A, PA-C  albuterol (VENTOLIN HFA) 108 (90 Base) MCG/ACT inhaler  05/16/21   [provider]  clobetasol ointment (TEMOVATE) 0.05 %  03/29/21   [provider]  famotidine (PEPCID) 20 MG tablet Take 1 tablet (20 mg total) by mouth at bedtime. Patient not taking: Reported on 12/10/2022 09/01/22   Arnaldo Natal, NP  fluticasone-salmeterol (ADVAIR) 100-50 MCG/ACT AEPB Inhale 1 puff into the lungs 2 (two)  times daily. Patient not taking: Reported on 12/10/2022    [provider]  Fluticasone-Umeclidin-Vilant (TRELEGY ELLIPTA) 200-62.5-25 MCG/INH AEPB Inhale 1 puff into the lungs daily. Patient not taking: Reported on 09/08/2022 05/07/21   Hunsucker, Lesia Sago, MD  gabapentin (NEURONTIN) 300 MG capsule Take 1 capsule (300 mg total) by mouth 3 (three) times daily. 01/10/21   Rancour, Jeannett Senior, MD  gabapentin (NEURONTIN) 600 MG tablet  01/10/21   [provider]  Glecaprevir-Pibrentasvir (MAVYRET) 100-40 MG TABS Take 3 tablets by mouth daily with breakfast. 09/24/22   Kuppelweiser, Cassie L, RPH-CPP  ibuprofen (ADVIL) 600 MG tablet  02/13/21   [provider]  naproxen (NAPROSYN) 500 MG tablet  01/24/21   [provider]  ondansetron (ZOFRAN) 4 MG tablet TAKE 1 TABLET BY MOUTH EVERY 8 HOURS AS NEEDED FOR NAUSEA OR VOMITING Patient not taking: Reported on 12/10/2022 11/14/22   Arnaldo Natal, NP  rosuvastatin (CRESTOR) 10 MG tablet Take 1 tablet every day by oral route. Patient not taking: Reported on 10/27/2022    [provider]  SSD 1 % cream APPLY TWICE DAILY ON AFFECTED AREAS Patient not taking: Reported on 12/10/2022 01/16/22   [provider]  SUMAtriptan (IMITREX) 50 MG tablet  01/24/21   [provider]      Allergies    Patient has no known allergies.    Review of Systems   Review of Systems  All other systems reviewed and are negative.   Physical Exam Updated Vital Signs BP 119/82 (BP Location: Right Arm)   Pulse  83   Temp 98.3 F (36.8 C) (Oral)   Resp 18   SpO2 98%  Physical Exam Vitals and nursing note reviewed.  Constitutional:      General: She is not in acute distress.    Appearance: Normal appearance.  Eyes:     General: No scleral icterus.    Extraocular Movements: Extraocular movements intact.  Cardiovascular:     Rate and Rhythm: Normal rate.  Pulmonary:     Effort: Pulmonary effort is normal. No  respiratory distress.  Abdominal:     Palpations: Abdomen is soft. There is no mass.     Tenderness: There is no abdominal tenderness.  Musculoskeletal:        General: Normal range of motion.     Cervical back: Neck supple.     Comments: TTP noted to left upper femur near inguinal region. No overlying skin changes. No TTP noted to left anterior hip. Decreased ROM of left hip secondary to pain. Able to flex and extend left knee against resistance without assistance or difficulty. Pedal pulse intact. Sensation intact to BLE. No spinal TTP. No TTP noted to musculature of back. Patient able to stand and put on her pants without assistance or difficulty.   Skin:    General: Skin is warm and dry.     Findings: No rash.  Neurological:     Mental Status: She is alert.     Sensory: Sensation is intact.     Motor: Motor function is intact.  Psychiatric:        Behavior: Behavior normal.     ED Results / Procedures / Treatments   Labs (all labs ordered are listed, but only abnormal results are displayed) Labs Reviewed - No data to display  EKG None  Radiology No results found.  Procedures Procedures    Medications Ordered in ED Medications  methocarbamol (ROBAXIN) tablet 500 mg (500 mg Oral Given 02/21/23 1453)  ketorolac (TORADOL) 15 MG/ML injection 15 mg (15 mg Intramuscular Given 02/21/23 1453)    ED Course/ Medical Decision Making/ A&P Clinical Course as of 02/21/23 1515  Sat Feb 21, 2023  1416 Offered xray of left hip, patient declines xray at this time and notes that it feels like a muscle that is the cause of her symptoms. She notes that she will follow up with her orthopedist, Dr. Luiz Blare this week for an xray if needed. Pt also notes that she is out of her migraine medications and requesting a refill. Discussed with patient that I am not able to give a full refill, however, I would give a short course, patient agreeable at this time.  [SB]  1435 Patient reevaluated and  patient noted to be putting on her clothes.  Discussed with patient we will send short course of her Fioricet to the pharmacy.  Patient agreeable this time.  Patient able to stand unassisted and without difficulty and put on her leggings by herself.  Discussed with patient discharge treatment plan.  Answered all available questions.  Patient appears safe for discharge at this time. [SB]    Clinical Course User Index [SB] Fount Bahe A, PA-C                             Medical Decision Making Risk Prescription drug management.   Pt presents with concerns for left leg pain x 2 days.  Notes that she feels as if she pulled a muscle.  Patient  afebrile.  On exam patient with tenderness to palpation noted to left upper femur near inguinal region.  No overlying skin changes.  No tenderness to palpation noted to left anterior hip.  Decreased range of motion of left hip secondary to pain.  Able to flex and extend left knee against resistance without assistance or difficulty.  Pedal pulse intact.  Sensation intact to bilateral lower extremities.  No spinal tenderness to palpation.  No tenderness to palpation noted to musculature of back.  Patient able to stand and put on her pants without assistance or difficulty.  Differential diagnosis includes strain, fracture, dislocation.    Co morbidities that complicate the patient evaluation: fibromyalgia, sciatica, neuropathy   Additional history obtained:  External records from outside source obtained and reviewed including: Pt has been evaluated by different neurology on 11/27/2021 for bilateral lower extremity pain, left greater than right.  At that time patient noted that her legs were aching.  At that time, patient had a nonfocal neurologic exam, due to patient being followed by [facility] orthopedics and getting injections into her neck as well as having her joint pain and back pain addressed with [facility], though provider [facility] had low  suspicion at that time for polyneuropathy even with the patient's history of alcoholism, recommended the patient follow-up with [facility].   Medications:  I ordered medication including Toradol, Robaxin, warm compress for pain management I have reviewed the patients home medicines and have made adjustments as needed  Disposition: Presenting suspicious for likely strain.  Doubt concerns at this time for fracture or dislocation, patient ambulatory in the room without assistance or difficulty.  No focal neurological deficits noted on exam.  After consideration of the diagnostic results and the patients response to treatment, I feel that the patient would benefit from Discharge home.  During H&P, patient declines x-ray at this time patient notes that she will follow-up with her orthopedist, Dr. Luiz Blare this week and get an x-ray if it is needed at that time.  Patient requesting a refill of her Fioricet.  Patient provided with 5 tabs of her Fioricet and instructed that she needs to follow-up with the primary prescriber for future refills.  Patient agreeable at this time.  Supportive care measures and strict return precautions discussed with patient at bedside. Pt acknowledges and verbalizes understanding. Pt appears safe for discharge. Follow up as indicated in discharge paperwork.    This chart was dictated using voice recognition software, Dragon. Despite the best efforts of this provider to proofread and correct errors, errors may still occur which can change documentation meaning.   Final Clinical Impression(s) / ED Diagnoses Final diagnoses:  Left leg pain    Rx / DC Orders ED Discharge Orders          Ordered    butalbital-acetaminophen-caffeine (FIORICET) 50-325-40 MG tablet  Daily PRN        02/21/23 1446              Babette Stum A, PA-C 02/21/23 1515    Mardene Sayer, MD 02/21/23 1750

## 2023-02-21 NOTE — ED Notes (Signed)
Patient given discharge instructions. Questions were answered. Patient verbalized understanding of discharge instructions and care at home. ? ?Discharged with family ?

## 2023-02-21 NOTE — ED Triage Notes (Signed)
Pt presents to ED POV. Pt c/o L leg pain x2d. Pt reports that she thinks she pulled a muscle in her groin. Pt reports that she is unable to bear weight and walks w/ difficulty.

## 2023-02-21 NOTE — Discharge Instructions (Addendum)
It was a pleasure taking care of you today!  You will be sent a short course of your Fioricet, take as prescribed to you.  It is important that you follow-up with your orthopedist regarding today's ED visit.  Continue to take your medications as prescribed.  You may follow-up with your primary care provider regarding today's ED visit.  You may place a warm compress to the affected area up to 15 minutes at a time, she will place a barrier between your skin and the warm compress.  Return to the emergency department if you are experiencing increasing/worsening symptoms.

## 2023-03-03 ENCOUNTER — Other Ambulatory Visit: Payer: Self-pay | Admitting: Orthopedic Surgery

## 2023-03-05 ENCOUNTER — Ambulatory Visit (HOSPITAL_COMMUNITY)
Admission: RE | Admit: 2023-03-05 | Discharge: 2023-03-05 | Disposition: A | Discharge status: Home / Self Care | Payer: Medicaid Other | Source: Ambulatory Visit | Attending: Orthopedic Surgery | Admitting: Orthopedic Surgery

## 2023-03-05 ENCOUNTER — Other Ambulatory Visit: Payer: Self-pay

## 2023-03-05 ENCOUNTER — Encounter (HOSPITAL_COMMUNITY)
Admission: RE | Admit: 2023-03-05 | Discharge: 2023-03-05 | Disposition: A | Discharge status: Home / Self Care | Payer: Medicaid Other | Source: Ambulatory Visit | Attending: Orthopedic Surgery | Admitting: Orthopedic Surgery

## 2023-03-05 ENCOUNTER — Encounter (HOSPITAL_COMMUNITY): Payer: Self-pay

## 2023-03-05 DIAGNOSIS — F1010 Alcohol abuse, uncomplicated: Secondary | ICD-10-CM | POA: Insufficient documentation

## 2023-03-05 DIAGNOSIS — J449 Chronic obstructive pulmonary disease, unspecified: Secondary | ICD-10-CM | POA: Insufficient documentation

## 2023-03-05 DIAGNOSIS — Z79899 Other long term (current) drug therapy: Secondary | ICD-10-CM | POA: Diagnosis not present

## 2023-03-05 DIAGNOSIS — I252 Old myocardial infarction: Secondary | ICD-10-CM | POA: Insufficient documentation

## 2023-03-05 DIAGNOSIS — S72001A Fracture of unspecified part of neck of right femur, initial encounter for closed fracture: Secondary | ICD-10-CM | POA: Insufficient documentation

## 2023-03-05 DIAGNOSIS — B182 Chronic viral hepatitis C: Secondary | ICD-10-CM | POA: Insufficient documentation

## 2023-03-05 DIAGNOSIS — F17200 Nicotine dependence, unspecified, uncomplicated: Secondary | ICD-10-CM | POA: Insufficient documentation

## 2023-03-05 DIAGNOSIS — Z01818 Encounter for other preprocedural examination: Secondary | ICD-10-CM | POA: Diagnosis present

## 2023-03-05 DIAGNOSIS — M797 Fibromyalgia: Secondary | ICD-10-CM | POA: Diagnosis not present

## 2023-03-05 HISTORY — DX: Unspecified osteoarthritis, unspecified site: M19.90

## 2023-03-05 HISTORY — DX: Malignant (primary) neoplasm, unspecified: C80.1

## 2023-03-05 HISTORY — DX: Inflammatory liver disease, unspecified: K75.9

## 2023-03-05 HISTORY — DX: Headache, unspecified: R51.9

## 2023-03-05 LAB — CBC
HCT: 42.3 % (ref 36.0–46.0)
Hemoglobin: 15.2 g/dL — ABNORMAL HIGH (ref 12.0–15.0)
MCH: 32.3 pg (ref 26.0–34.0)
MCHC: 35.9 g/dL (ref 30.0–36.0)
MCV: 90 fL (ref 80.0–100.0)
Platelets: 247 10*3/uL (ref 150–400)
RBC: 4.7 MIL/uL (ref 3.87–5.11)
RDW: 12.6 % (ref 11.5–15.5)
WBC: 7.3 10*3/uL (ref 4.0–10.5)
nRBC: 0 % (ref 0.0–0.2)

## 2023-03-05 LAB — COMPREHENSIVE METABOLIC PANEL
ALT: 15 U/L (ref 0–44)
AST: 15 U/L (ref 15–41)
Albumin: 4.1 g/dL (ref 3.5–5.0)
Alkaline Phosphatase: 109 U/L (ref 38–126)
Anion gap: 9 (ref 5–15)
BUN: <5 mg/dL — ABNORMAL LOW (ref 8–23)
CO2: 24 mmol/L (ref 22–32)
Calcium: 8.9 mg/dL (ref 8.9–10.3)
Chloride: 97 mmol/L — ABNORMAL LOW (ref 98–111)
Creatinine, Ser: 0.5 mg/dL (ref 0.44–1.00)
GFR, Estimated: >60 mL/min (ref >60)
Glucose, Bld: 98 mg/dL (ref 70–99)
Potassium: 4.4 mmol/L (ref 3.5–5.1)
Sodium: 130 mmol/L — ABNORMAL LOW (ref 135–145)
Total Bilirubin: 0.8 mg/dL (ref 0.3–1.2)
Total Protein: 7.6 g/dL (ref 6.5–8.1)

## 2023-03-05 LAB — SURGICAL PCR SCREEN
MRSA, PCR: NEGATIVE
Staphylococcus aureus: NEGATIVE

## 2023-03-05 NOTE — Progress Notes (Addendum)
Anesthesia Review:  PCP: DR  Aniceto Boss  Cardiologist : none  INF Disease- DR Thedore Mins LOV 12/10/22 - for Hep C  Next OV 03/11/23 at 245pm  Chest x-ray : 2v- 03/05/23  EKG : 03/05/23  Echo : Stress test: Cardiac Cath :  Activity level: can do a flight of stairs without difficutly  Sleep Study/ CPAP : none  Fasting Blood Sugar :      / Checks Blood Sugar -- times a day:   Blood Thinner/ Instructions /Last Dose: ASA / Instructions/ Last Dose :    PT has appt with ENT in regards to sinus inssues on 03/17/23 per pt.     Smoker  PT with hx of facial reconstruction surgery.  Has plates in face per pt .     In ED on 02/21/23 with left leg pain  CMP done 03/05/23 with sodium of 130 routed to Dr Jodi Geralds on 03/05/23.

## 2023-03-05 NOTE — Patient Instructions (Signed)
SURGICAL WAITING ROOM VISITATION  Patients having surgery or a procedure may have no more than 2 support people in the waiting area - these visitors may rotate.    Children under the age of 40 must have an adult with them who is not the patient.  Due to an increase in RSV and influenza rates and associated hospitalizations, children ages 52 and under may not visit patients in [facility] hospitals.  If the patient needs to stay at the hospital during part of their recovery, the visitor guidelines for inpatient rooms apply. Pre-op nurse will coordinate an appropriate time for 1 support person to accompany patient in pre-op.  This support person may not rotate.    Please refer to the [facility] website for the visitor guidelines for Inpatients (after your surgery is over and you are in a regular room).       Your procedure is scheduled on: 03/09/2023    Report to [facility] Main Entrance    Report to admitting at   145pm    Call this number if you have problems the morning of surgery [phone]   Do not eat food :After Midnight.   After Midnight you may have the following liquids until  115 pm ______ DAY OF SURGERY  Water Non-Citrus Juices (without pulp, NO RED-Apple, White grape, White cranberry) Black Coffee (NO MILK/CREAM OR CREAMERS, sugar ok)  Clear Tea (NO MILK/CREAM OR CREAMERS, sugar ok) regular and decaf                             Plain Jell-O (NO RED)                                           Fruit ices (not with fruit pulp, NO RED)                                     Popsicles (NO RED)                                                               Sports drinks like Gatorade (NO RED)                    The day of surgery:  Drink ONE (1) Pre-Surgery Clear Ensure or G2 at    115 pm ( have completed by )  the morning of surgery. Drink in one sitting. Do not sip.  This drink was given to you during your hospital  pre-op appointment visit. Nothing else to  drink after completing the  Pre-Surgery Clear Ensure or G2.          If you have questions, please contact your surgeon's office.       Oral Hygiene is also important to reduce your risk of infection.                                    Remember - BRUSH YOUR TEETH THE MORNING  OF SURGERY WITH YOUR REGULAR TOOTHPASTE  DENTURES WILL BE REMOVED PRIOR TO SURGERY PLEASE DO NOT APPLY "Poly grip" OR ADHESIVES!!!   Do NOT smoke after Midnight   Take these medicines the morning of surgery with A SIP OF WATER:  Inhalers as usual and bring, gabapentin   DO NOT TAKE ANY ORAL DIABETIC MEDICATIONS DAY OF YOUR SURGERY  Bring CPAP mask and tubing day of surgery.                              You may not have any metal on your body including hair pins, jewelry, and body piercing             Do not wear make-up, lotions, powders, perfumes/cologne, or deodorant  Do not wear nail polish including gel and S&S, artificial/acrylic nails, or any other type of covering on natural nails including finger and toenails. If you have artificial nails, gel coating, etc. that needs to be removed by a nail salon please have this removed prior to surgery or surgery may need to be canceled/ delayed if the surgeon/ anesthesia feels like they are unable to be safely monitored.   Do not shave  48 hours prior to surgery.               Men may shave face and neck.   Do not bring valuables to the hospital. [facility] IS NOT             RESPONSIBLE   FOR VALUABLES.   Contacts, glasses, dentures or bridgework may not be worn into surgery.   Bring small overnight bag day of surgery.   DO NOT BRING YOUR HOME MEDICATIONS TO THE HOSPITAL. PHARMACY WILL DISPENSE MEDICATIONS LISTED ON YOUR MEDICATION LIST TO YOU DURING YOUR ADMISSION IN THE HOSPITAL!    Patients discharged on the day of surgery will not be allowed to drive home.  Someone NEEDS to stay with you for the first 24 hours after anesthesia.   Special Instructions:  Bring a copy of your healthcare power of attorney and living will documents the day of surgery if you haven't scanned them before.              Please read over the following fact sheets you were given: IF YOU HAVE QUESTIONS ABOUT YOUR PRE-OP INSTRUCTIONS PLEASE CALL [phone]   If you received a COVID test during your pre-op visit  it is requested that you wear a mask when out in public, stay away from anyone that may not be feeling well and notify your surgeon if you develop symptoms. If you test positive for Covid or have been in contact with anyone that has tested positive in the last 10 days please notify you surgeon.      Pre-operative 5 CHG Bath Instructions   You can play a key role in reducing the risk of infection after surgery. Your skin needs to be as free of germs as possible. You can reduce the number of germs on your skin by washing with CHG (chlorhexidine gluconate) soap before surgery. CHG is an antiseptic soap that kills germs and continues to kill germs even after washing.   DO NOT use if you have an allergy to chlorhexidine/CHG or antibacterial soaps. If your skin becomes reddened or irritated, stop using the CHG and notify one of our RNs at [phone].   Please shower with the CHG soap starting 4 days before surgery  using the following schedule:     Please keep in mind the following:  DO NOT shave, including legs and underarms, starting the day of your first shower.   You may shave your face at any point before/day of surgery.  Place clean sheets on your bed the day you start using CHG soap. Use a clean washcloth (not used since being washed) for each shower. DO NOT sleep with pets once you start using the CHG.   CHG Shower Instructions:  If you choose to wash your hair and private area, wash first with your normal shampoo/soap.  After you use shampoo/soap, rinse your hair and body thoroughly to remove shampoo/soap residue.  Turn the water OFF and apply about 3  tablespoons (45 ml) of CHG soap to a CLEAN washcloth.  Apply CHG soap ONLY FROM YOUR NECK DOWN TO YOUR TOES (washing for 3-5 minutes)  DO NOT use CHG soap on face, private areas, open wounds, or sores.  Pay special attention to the area where your surgery is being performed.  If you are having back surgery, having someone wash your back for you may be helpful. Wait 2 minutes after CHG soap is applied, then you may rinse off the CHG soap.  Pat dry with a clean towel  Put on clean clothes/pajamas   If you choose to wear lotion, please use ONLY the CHG-compatible lotions on the back of this paper.     Additional instructions for the day of surgery: DO NOT APPLY any lotions, deodorants, cologne, or perfumes.   Put on clean/comfortable clothes.  Brush your teeth.  Ask your nurse before applying any prescription medications to the skin.      CHG Compatible Lotions   Aveeno Moisturizing lotion  Cetaphil Moisturizing Cream  Cetaphil Moisturizing Lotion  Clairol Herbal Essence Moisturizing Lotion, Dry Skin  Clairol Herbal Essence Moisturizing Lotion, Extra Dry Skin  Clairol Herbal Essence Moisturizing Lotion, Normal Skin  Curel Age Defying Therapeutic Moisturizing Lotion with Alpha Hydroxy  Curel Extreme Care Body Lotion  Curel Soothing Hands Moisturizing Hand Lotion  Curel Therapeutic Moisturizing Cream, Fragrance-Free  Curel Therapeutic Moisturizing Lotion, Fragrance-Free  Curel Therapeutic Moisturizing Lotion, Original Formula  Eucerin Daily Replenishing Lotion  Eucerin Dry Skin Therapy Plus Alpha Hydroxy Crme  Eucerin Dry Skin Therapy Plus Alpha Hydroxy Lotion  Eucerin Original Crme  Eucerin Original Lotion  Eucerin Plus Crme Eucerin Plus Lotion  Eucerin TriLipid Replenishing Lotion  Keri Anti-Bacterial Hand Lotion  Keri Deep Conditioning Original Lotion Dry Skin Formula Softly Scented  Keri Deep Conditioning Original Lotion, Fragrance Free Sensitive Skin Formula  Keri  Lotion Fast Absorbing Fragrance Free Sensitive Skin Formula  Keri Lotion Fast Absorbing Softly Scented Dry Skin Formula  Keri Original Lotion  Keri Skin Renewal Lotion Keri Silky Smooth Lotion  Keri Silky Smooth Sensitive Skin Lotion  Nivea Body Creamy Conditioning Oil  Nivea Body Extra Enriched Teacher, adult education Moisturizing Lotion Nivea Crme  Nivea Skin Firming Lotion  NutraDerm 30 Skin Lotion  NutraDerm Skin Lotion  NutraDerm Therapeutic Skin Cream  NutraDerm Therapeutic Skin Lotion  ProShield Protective Hand Cream  Provon moisturizing lotion

## 2023-03-06 ENCOUNTER — Encounter (HOSPITAL_COMMUNITY): Payer: Self-pay | Admitting: Anesthesiology

## 2023-03-06 ENCOUNTER — Encounter (HOSPITAL_COMMUNITY): Payer: Self-pay | Admitting: Physician Assistant

## 2023-03-06 NOTE — Progress Notes (Signed)
Anesthesia Chart Review   Case: [id number] Date/Time: 03/09/23 1710   Procedure: TOTAL HIP ARTHROPLASTY (Right: Hip)   Anesthesia type: Spinal   Pre-op diagnosis: RIGHT HIP FEMORAL NECK FRACTURE   Location: [facility] ROOM 10 / [facility]   Surgeons: Jodi Geralds, MD       DISCUSSION:62 y.o. smoker with h/o COPD, chronic hepatitis C, h/o MI, right hip femoral neck fracture scheduled for above procedure 03/09/2023 with Dr. Jodi Geralds.   Alcohol use disorder, per notes abstinent for 11 years. 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  Follows with ID for chronic Hep C. Last seen by ID 12/10/2022, at this time had pt had several days left of Hep C tx with Mavyret.   Pt reports she was told she had an MI before, she reports no interventions. She was seen by cardiology in [place] at the time, states she had a stress test which was normal.  Pt unable to remember the name of the clinic, I will try to find results.   She has limited activity due to knee and hip pain. Reports she has baseline shortness of breath due to COPD and seasonal allergies.  Reports shortness of breath at baseline. States she has not had to use her inhaler very much recently.    EKG 03/05/2023 NSR  Discussed with Dr. Bradley Ferris.  Evaluate DOS.  VS: BP 124/78   Pulse 73   Temp 36.9 C (Oral)   Resp 16   Ht 5\' 4"  (1.626 m)   Wt 84.4 kg   SpO2 99%   BMI 31.93 kg/m   PROVIDERS: Remus Loffler, PA-C is PCP   LABS: Labs reviewed: Acceptable for surgery. (all labs ordered are listed, but only abnormal results are displayed)  Labs Reviewed - No data to display   IMAGES:   EKG:   CV:  Past Medical History:  Diagnosis Date   Arthritis    Cancer (HCC)    melnoma on nose   Cirrhosis (HCC)    COPD (chronic obstructive pulmonary disease) (HCC)    Fibromyalgia    Headache    Hepatitis     Past Surgical History:  Procedure Laterality Date   ABDOMINAL HYSTERECTOMY     FACIAL RECONSTRUCTION SURGERY     KNEE SURGERY       MEDICATIONS:  albuterol (VENTOLIN HFA) 108 (90 Base) MCG/ACT inhaler   butalbital-acetaminophen-caffeine (FIORICET) 50-325-40 MG tablet   famotidine (PEPCID) 20 MG tablet   Fluticasone-Umeclidin-Vilant (TRELEGY ELLIPTA) 200-62.5-25 MCG/INH AEPB   gabapentin (NEURONTIN) 300 MG capsule   gabapentin (NEURONTIN) 600 MG tablet   Glecaprevir-Pibrentasvir (MAVYRET) 100-40 MG TABS   HYDROcodone-acetaminophen (NORCO/VICODIN) 5-325 MG tablet   methocarbamol (ROBAXIN) 500 MG tablet   ondansetron (ZOFRAN) 4 MG tablet   ondansetron (ZOFRAN-ODT) 4 MG disintegrating tablet   No current facility-administered medications for this encounter.     Jodell Cipro Ward, PA-C [facility] [phone]

## 2023-03-06 NOTE — Anesthesia Preprocedure Evaluation (Signed)
Anesthesia Evaluation    Airway        Dental   Pulmonary Current Smoker          Cardiovascular      Neuro/Psych    GI/Hepatic   Endo/Other    Renal/GU      Musculoskeletal   Abdominal   Peds  Hematology   Anesthesia Other Findings   Reproductive/Obstetrics                             Anesthesia Physical Anesthesia Plan  ASA:   Anesthesia Plan:    Post-op Pain Management:    Induction:   PONV Risk Score and Plan:   Airway Management Planned:   Additional Equipment:   Intra-op Plan:   Post-operative Plan:   Informed Consent:   Plan Discussed with:   Anesthesia Plan Comments: (See PAT note 03/05/2023)       Anesthesia Quick Evaluation

## 2023-03-07 IMAGING — DX DG KNEE COMPLETE 4+V*R*
1 series · 4 of 4 positions shown · non-contrast
Comparison: None.

CLINICAL DATA: Status post fall.

EXAM:
RIGHT KNEE - COMPLETE 4+ VIEW

[Series 1: knee · 0.14mm/px · 4 of 4 slices shown]
[im 1/4]
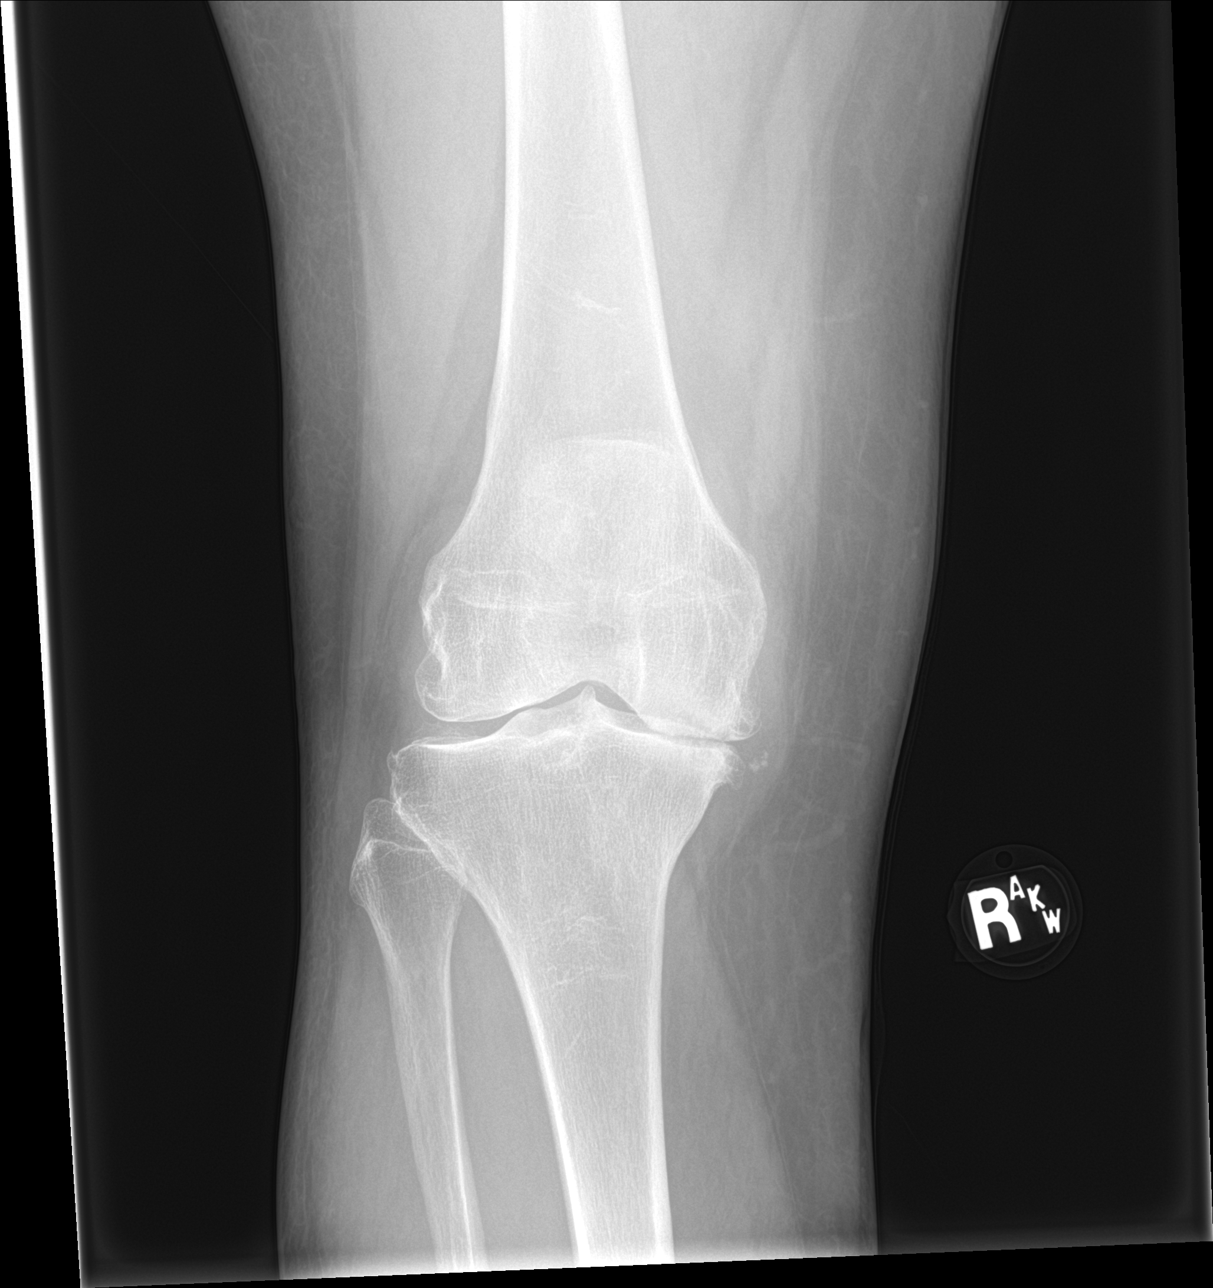
[im 2/4]
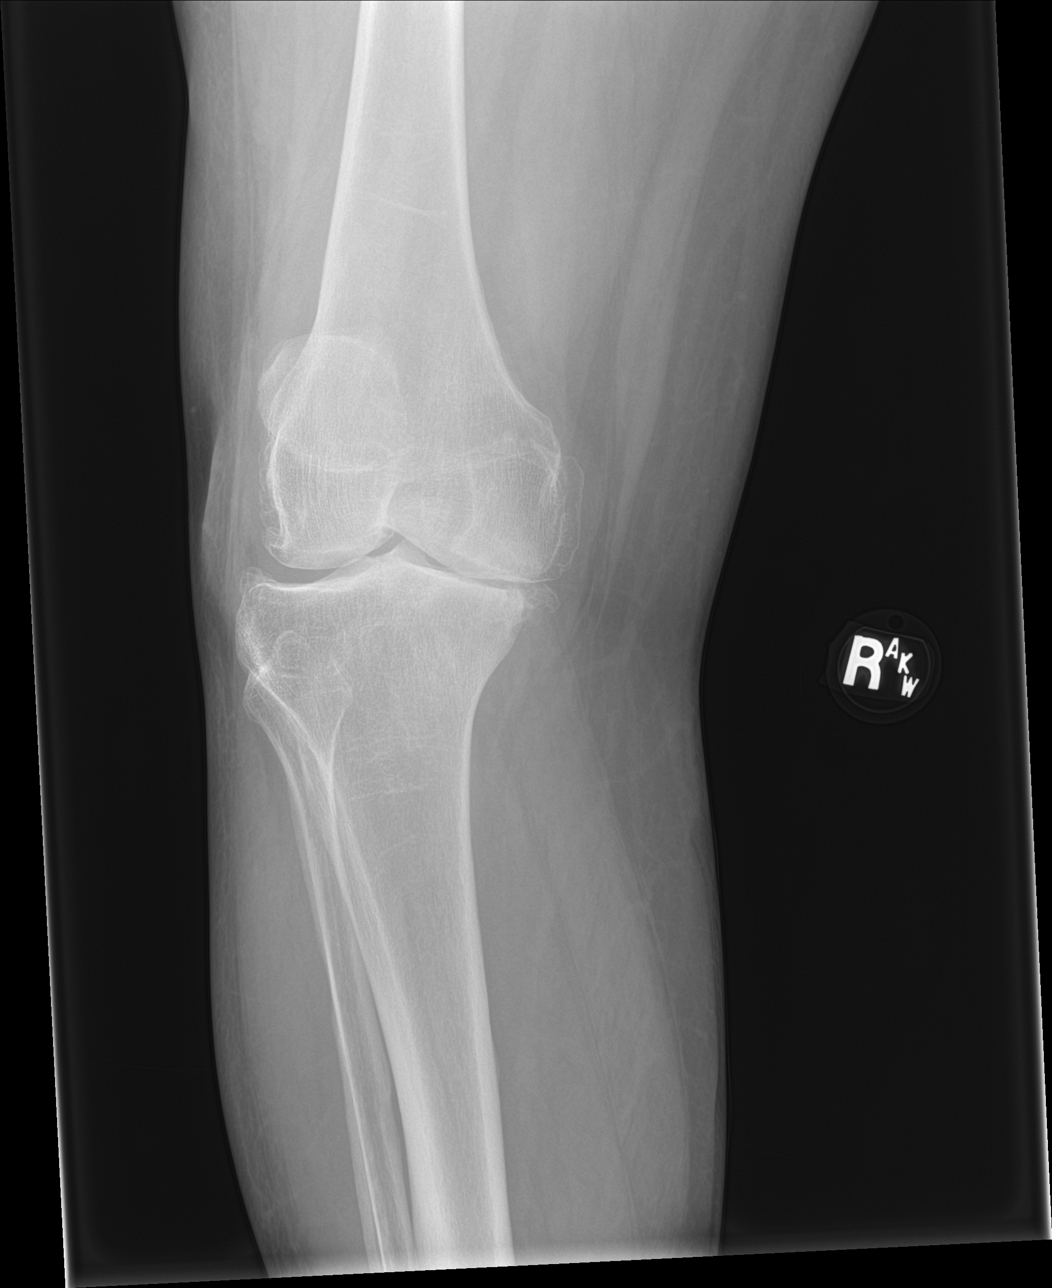
[im 3/4]
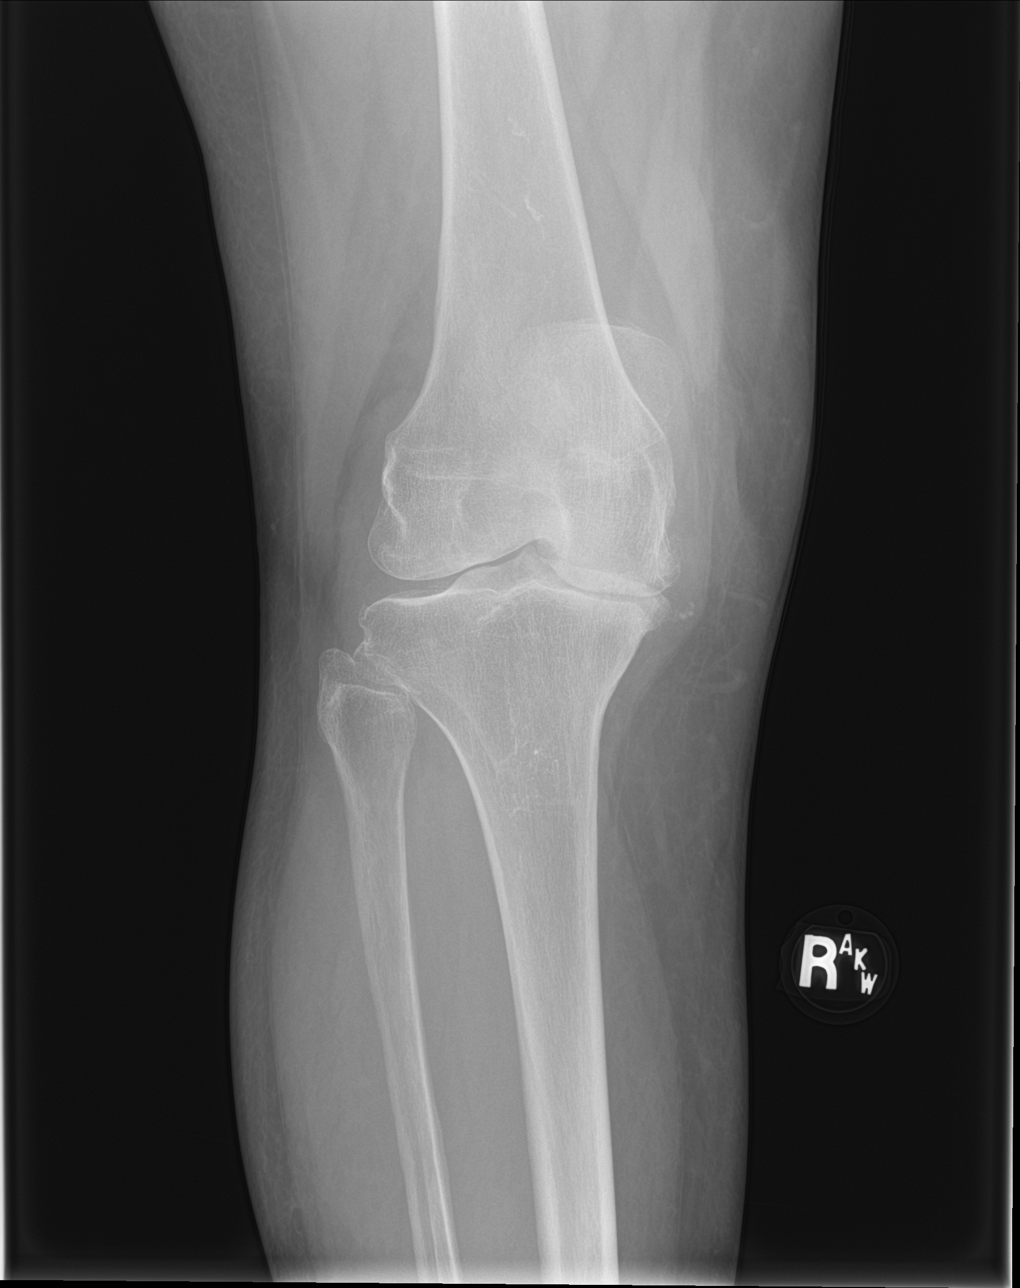
[im 4/4]
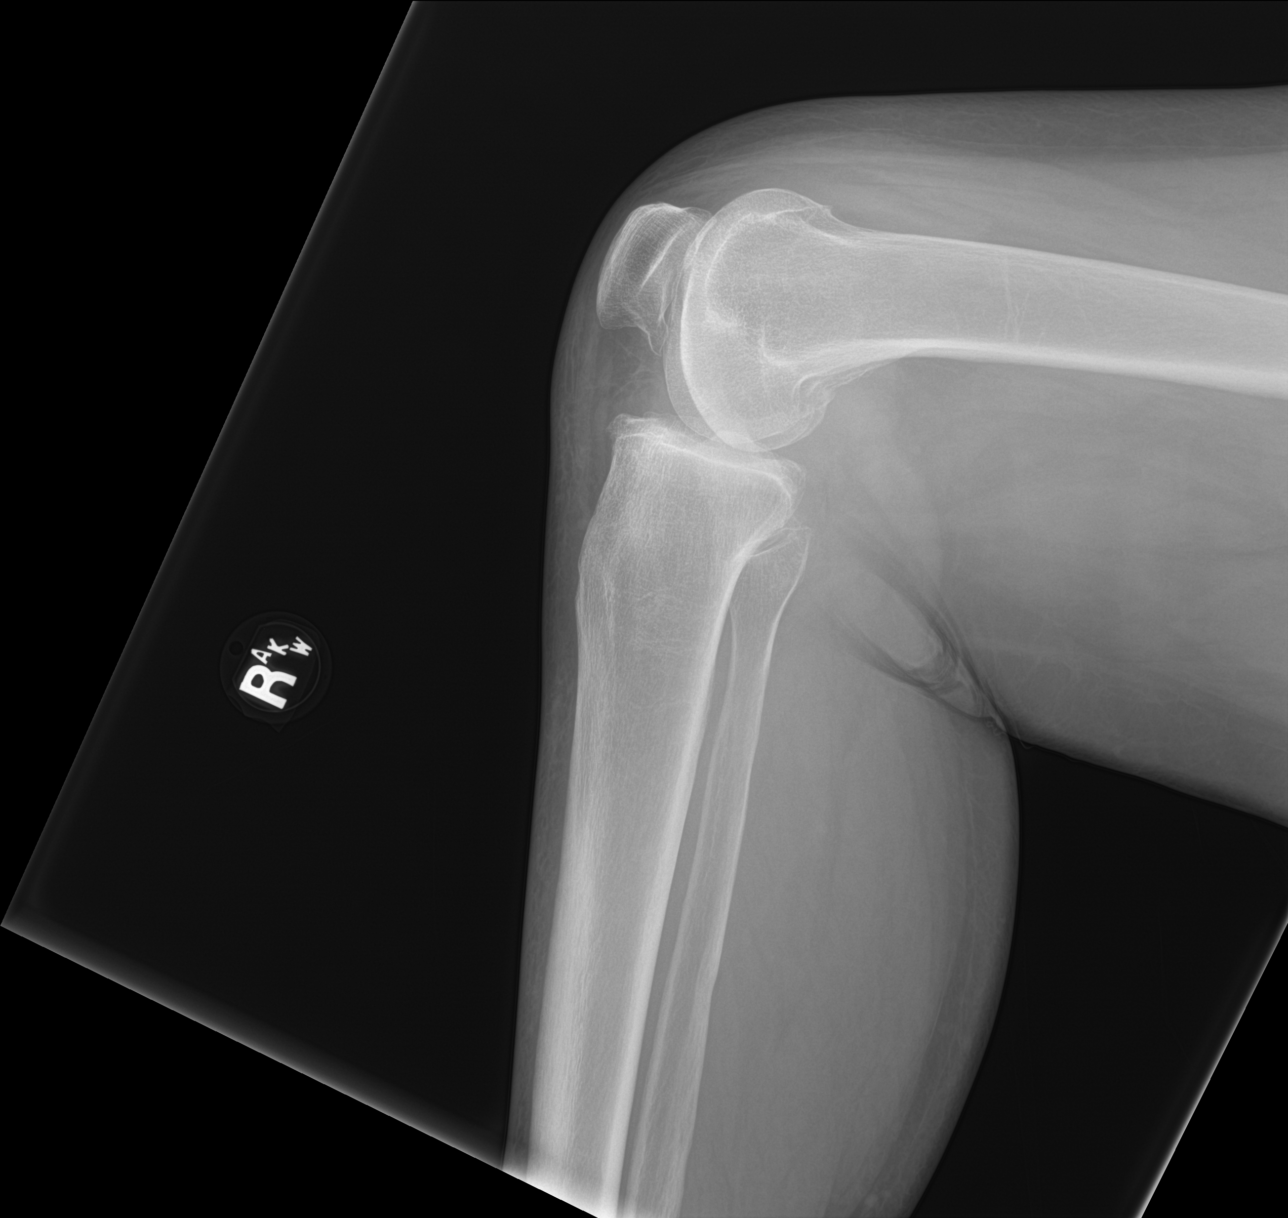

[4 of 4 positions shown; findings below may reference images not displayed]

FINDINGS: No evidence of an acute fracture or dislocation. There is marked
severity patellofemoral and medial tibiofemoral compartment space
narrowing. Moderate severity lateral tibiofemoral compartment space
narrowing is also seen. A small to moderate sized joint effusion is
noted.
IMPRESSION: 1. No evidence of acute fracture or dislocation.
2. Moderate to marked severity degenerative changes with a small to
moderate sized joint effusion.

## 2023-03-09 ENCOUNTER — Ambulatory Visit (HOSPITAL_COMMUNITY): Admission: RE | Admit: 2023-03-09 | Payer: Medicaid Other | Source: Home / Self Care | Admitting: Orthopedic Surgery

## 2023-03-09 DIAGNOSIS — Z01818 Encounter for other preprocedural examination: Secondary | ICD-10-CM

## 2023-03-09 SURGERY — ARTHROPLASTY, HIP, TOTAL,POSTERIOR APPROACH
Anesthesia: Spinal | Site: Hip | Laterality: Right

## 2023-03-11 ENCOUNTER — Other Ambulatory Visit: Payer: Self-pay

## 2023-03-11 ENCOUNTER — Encounter: Payer: Self-pay | Admitting: Internal Medicine

## 2023-03-11 ENCOUNTER — Ambulatory Visit: Payer: Medicaid Other | Admitting: Internal Medicine

## 2023-03-11 VITALS — BP 130/80 | HR 89 | Temp 98.4°F | Ht 64.0 in | Wt 180.0 lb

## 2023-03-11 DIAGNOSIS — B1920 Unspecified viral hepatitis C without hepatic coma: Secondary | ICD-10-CM

## 2023-03-11 LAB — CBC WITH DIFFERENTIAL/PLATELET
Lymphs Abs: 2814 cells/uL (ref 850–3900)
MCH: 31.9 pg (ref 27.0–33.0)
RBC: 4.55 10*6/uL (ref 3.80–5.10)
RDW: 12.1 % (ref 11.0–15.0)

## 2023-03-11 NOTE — Progress Notes (Signed)
Patient: Cassidy Stevens  DOB: [birth year] MRN: [id number] PCP: Remus Loffler, PA-C    Chief Complaint  Patient presents with   Follow-up    Hep C     Patient Active Problem List   Diagnosis Date Noted   Pain in both lower extremities 11/27/2021   Chronic midline low back pain without sciatica 11/27/2021   Chronic neck pain 11/27/2021     Subjective:  Cassidy Stevens is a 62 y.o. alcohol use disorder abstinent x 11 years, remote history of IVDA, last use 23 years ago, migraine headaches, coronary artery disease status post MI, COPD, gallstones referred from GI for management of hepatitis C.  Noted that she was diagnosed with hep C about 30 years ago.  Per GIs note there was a CT on 06/24/2022 showed a slightly lobular hepatic contour is without focal liver lesion, cyst with stable renal shunting suggestive of portal hypertension and mildly distended gallbladder, with which was concerning for cirrhosis.  Viral load at GI office was 3.29 million.  MRI abdominal ordered and plan for EGD if MRI or identified cirrhosis. Pt believes she contrived HCV form husband.  Daughter on the phone during the visit. 12/10/22: No new complaints. Taking mavyret about 8-9 days left.    Today 03/11/23: Pt states she has back and hip pain since fall.   Review of Systems  All other systems reviewed and are negative.   Past Medical History:  Diagnosis Date   Arthritis    Cancer (HCC)    melnoma on nose   Cirrhosis (HCC)    COPD (chronic obstructive pulmonary disease) (HCC)    Fibromyalgia    Headache    Hepatitis     Outpatient Medications Prior to Visit  Medication Sig Dispense Refill   albuterol (VENTOLIN HFA) 108 (90 Base) MCG/ACT inhaler Inhale 1-2 puffs into the lungs every 6 (six) hours as needed for wheezing.     butalbital-acetaminophen-caffeine (FIORICET) 50-325-40 MG tablet Take 1 tablet by mouth daily as needed for headache. 5 tablet 0   Fluticasone-Umeclidin-Vilant  (TRELEGY ELLIPTA) 200-62.5-25 MCG/INH AEPB Inhale 1 puff into the lungs daily. (Patient taking differently: Inhale 1 puff into the lungs daily as needed (wheezing).) 60 each 11   gabapentin (NEURONTIN) 300 MG capsule Take 1 capsule (300 mg total) by mouth 3 (three) times daily. 30 capsule 0   gabapentin (NEURONTIN) 600 MG tablet Take 300-600 mg by mouth 3 (three) times daily.     HYDROcodone-acetaminophen (NORCO/VICODIN) 5-325 MG tablet Take 1 tablet by mouth every 6 (six) hours as needed for moderate pain.     methocarbamol (ROBAXIN) 500 MG tablet Take 500 mg by mouth at bedtime.     ondansetron (ZOFRAN) 4 MG tablet TAKE 1 TABLET BY MOUTH EVERY 8 HOURS AS NEEDED FOR NAUSEA OR VOMITING 30 tablet 0   ondansetron (ZOFRAN-ODT) 4 MG disintegrating tablet Take 4 mg by mouth every 8 (eight) hours as needed for nausea or vomiting.     tiZANidine (ZANAFLEX) 2 MG tablet Take 2 mg by mouth 3 (three) times daily.     Glecaprevir-Pibrentasvir (MAVYRET) 100-40 MG TABS Take 3 tablets by mouth daily with breakfast. 84 tablet 1   famotidine (PEPCID) 20 MG tablet Take 1 tablet (20 mg total) by mouth at bedtime. (Patient not taking: Reported on 03/11/2023) 30 tablet 1   No facility-administered medications prior to visit.     No Known Allergies  Social History   Tobacco Use   Smoking  status: Every Day    Packs/day: .5    Types: Cigarettes   Smokeless tobacco: Never   Tobacco comments:    Working on quitting  Vaping Use   Vaping Use: Never used  Substance Use Topics   Alcohol use: Not Currently   Drug use: Never    Family History  Problem Relation Age of Onset   Neuropathy Maternal Aunt    Neuropathy Maternal Uncle     Objective:   Vitals:   03/11/23 1457  BP: 130/80  Pulse: 89  Temp: 98.4 F (36.9 C)  TempSrc: Temporal  Weight: 180 lb (81.6 kg)  Height: 5\' 4"  (1.626 m)   Body mass index is 30.9 kg/m.  Physical Exam Constitutional:      Appearance: Normal appearance.  HENT:      Head: Normocephalic and atraumatic.     Right Ear: Tympanic membrane normal.     Left Ear: Tympanic membrane normal.     Nose: Nose normal.     Mouth/Throat:     Mouth: Mucous membranes are moist.  Eyes:     Extraocular Movements: Extraocular movements intact.     Conjunctiva/sclera: Conjunctivae normal.     Pupils: Pupils are equal, round, and reactive to light.  Cardiovascular:     Rate and Rhythm: Normal rate and regular rhythm.     Heart sounds: No murmur heard.    No friction rub. No gallop.  Pulmonary:     Effort: Pulmonary effort is normal.     Breath sounds: Normal breath sounds.  Abdominal:     General: Abdomen is flat.     Palpations: Abdomen is soft.  Musculoskeletal:        General: Normal range of motion.  Skin:    General: Skin is warm and dry.  Neurological:     General: No focal deficit present.     Mental Status: She is alert and oriented to person, place, and time.  Psychiatric:        Mood and Affect: Mood normal.     Lab Results: Lab Results  Component Value Date   WBC 7.3 03/05/2023   HGB 15.2 (H) 03/05/2023   HCT 42.3 03/05/2023   MCV 90.0 03/05/2023   PLT 247 03/05/2023    Lab Results  Component Value Date   CREATININE 0.50 03/05/2023   BUN <5 (L) 03/05/2023   NA 130 (L) 03/05/2023   K 4.4 03/05/2023   CL 97 (L) 03/05/2023   CO2 24 03/05/2023    Lab Results  Component Value Date   ALT 15 03/05/2023   AST 15 03/05/2023   GGT 128 (H) 09/08/2022   ALKPHOS 109 03/05/2023   BILITOT 0.8 03/05/2023     Assessment & Plan:  #Chronic Hepatitis C with VL on 12/4 3.29 M #History of EtOH abuse-11 years ago with last drink #IVDA-remote 23 years ago Prior treatment:  None GT: 1a Evidence of cirrhosis: F3 Mavyret x 8 weeks, she has about 8-9 around 4 week mark (VL, 57) doses left as she was sick and did not take her meds. EOT treatment VL <15 on 12/22/22 - Declined  HBV immunizaiton - Today  will get SVR -U/S in 6 months, pt plans on seeing a  liver specialist      #Gallstones with nausea - No plans for choly at this time - EGD at time of colonoscopy did not note varices, non-ulcer gastritis   #Infrequent rectal bleeding - MRI suggestive of cirrhosis, no supicious hepatic  lesion, portal venous HTN, distended GB.  - Colonoscopy with multiple polyps(no malignancy noted on path)   #Fall with back and hip pain -Pt stated , she canceled on surgery on Monday, but plan s to reschedule. Would like to go to rehab after.   #Counseled on smoking cessation   Counseling done on the following -Natural progression of hep c, transmission (avoid sharing personal hygiene equipment), prevention, risks of left untreated and treatment options  -Avoid hepatotoxins like alcohol and excessive acetamaminphen (no more than 2 gram a day) -Avoid eating raw sea food -Risks of re-infection  -Hepatitis coinfection and vaccination( Pneumococcal vaccination in the cirrhotics   Danelle Earthly, MD [facility]   03/11/23  3:03 PM   I have personally spent 35 minutes involved in face-to-face and non-face-to-face activities for this patient on the day of the visit. Professional time spent includes the following activities: Preparing to see the patient (review of tests), Obtaining and/or reviewing separately obtained history (admission/discharge record), Performing a medically appropriate examination and/or evaluation , Ordering medications/tests/procedures, referring and communicating with other health care professionals, Documenting clinical information in the EMR, Independently interpreting results (not separately reported), Communicating results to the patient/family/caregiver, Counseling and educating the patient/family/caregiver and Care coordination (not separately reported).

## 2023-03-12 LAB — COMPLETE METABOLIC PANEL WITH GFR
AG Ratio: 1.6 (calc) (ref 1.0–2.5)
Globulin: 2.5 g/dL (calc) (ref 1.9–3.7)
Sodium: 128 mmol/L — ABNORMAL LOW (ref 135–146)
Total Protein: 6.6 g/dL (ref 6.1–8.1)

## 2023-03-13 LAB — CBC WITH DIFFERENTIAL/PLATELET
Absolute Monocytes: 589 cells/uL (ref 200–950)
Basophils Absolute: 33 cells/uL (ref 0–200)
Basophils Relative: 0.4 %
Eosinophils Absolute: 8 cells/uL — ABNORMAL LOW (ref 15–500)
Eosinophils Relative: 0.1 %
HCT: 40.8 % (ref 35.0–45.0)
Hemoglobin: 14.5 g/dL (ref 11.7–15.5)
MCHC: 35.5 g/dL (ref 32.0–36.0)
MCV: 89.7 fL (ref 80.0–100.0)
MPV: 10.1 fL (ref 7.5–12.5)
Monocytes Relative: 7.1 %
Neutro Abs: 4856 cells/uL (ref 1500–7800)
Neutrophils Relative %: 58.5 %
Platelets: 239 10*3/uL (ref 140–400)
Total Lymphocyte: 33.9 %
WBC: 8.3 10*3/uL (ref 3.8–10.8)

## 2023-03-13 LAB — COMPLETE METABOLIC PANEL WITH GFR
ALT: 11 U/L (ref 6–29)
AST: 15 U/L (ref 10–35)
Albumin: 4.1 g/dL (ref 3.6–5.1)
Alkaline phosphatase (APISO): 142 U/L (ref 37–153)
BUN/Creatinine Ratio: 11 (calc) (ref 6–22)
BUN: 5 mg/dL — ABNORMAL LOW (ref 7–25)
CO2: 25 mmol/L (ref 20–32)
Calcium: 8.5 mg/dL — ABNORMAL LOW (ref 8.6–10.4)
Chloride: 93 mmol/L — ABNORMAL LOW (ref 98–110)
Creat: 0.45 mg/dL — ABNORMAL LOW (ref 0.50–1.05)
Glucose, Bld: 93 mg/dL (ref 65–99)
Potassium: 3.8 mmol/L (ref 3.5–5.3)
Total Bilirubin: 0.5 mg/dL (ref 0.2–1.2)
eGFR: 109 mL/min/{1.73_m2} (ref >=60)

## 2023-03-13 LAB — HEPATITIS C RNA QUANTITATIVE
HCV Quantitative Log: <1.18 log IU/mL
HCV RNA, PCR, QN: <15 IU/mL

## 2023-03-17 ENCOUNTER — Other Ambulatory Visit: Payer: Self-pay | Admitting: Internal Medicine

## 2023-03-17 DIAGNOSIS — Z1231 Encounter for screening mammogram for malignant neoplasm of breast: Secondary | ICD-10-CM

## 2023-03-18 ENCOUNTER — Telehealth: Payer: Self-pay | Admitting: *Deleted

## 2023-03-18 ENCOUNTER — Other Ambulatory Visit: Payer: Self-pay | Admitting: *Deleted

## 2023-03-18 DIAGNOSIS — R1010 Upper abdominal pain, unspecified: Secondary | ICD-10-CM

## 2023-03-18 NOTE — Telephone Encounter (Signed)
Patient called to notify of her 6 month F/U appt for the MRI ABD.Patient states she recently broke her hip 6 weeks ago and wants to maintain the appt scheduled for her 6 month. Informed the patient that I could inform the PA to see if there is a posssible change in appt due to her injury; patient refused. Informed to also let imaging know so they can extend it out as far as possible toward the end of this month. Patient understood and agreed.

## 2023-03-18 NOTE — Telephone Encounter (Signed)
-----   Message from Chrystie Nose, RN sent at 03/18/2023 10:29 AM EDT -----  ----- Message ----- From: Lucky Cowboy, RN Sent: 03/18/2023   8:00 AM EDT To: Emeline Darling, RN  Patient needs 6 month follow up MRI. Refer to imaging note 09/01/22.

## 2023-03-26 ENCOUNTER — Emergency Department (HOSPITAL_COMMUNITY): Payer: Medicaid Other

## 2023-03-26 ENCOUNTER — Inpatient Hospital Stay (HOSPITAL_COMMUNITY)
Admission: EM | Admit: 2023-03-26 | Discharge: 2023-03-31 | DRG: 522 | Disposition: A | Discharge status: Skilled Nursing Facility | Payer: Medicaid Other | Attending: Family Medicine | Admitting: Family Medicine

## 2023-03-26 ENCOUNTER — Encounter (HOSPITAL_COMMUNITY): Payer: Self-pay | Admitting: Internal Medicine

## 2023-03-26 DIAGNOSIS — S72001A Fracture of unspecified part of neck of right femur, initial encounter for closed fracture: Secondary | ICD-10-CM | POA: Diagnosis not present

## 2023-03-26 DIAGNOSIS — W19XXXA Unspecified fall, initial encounter: Secondary | ICD-10-CM | POA: Diagnosis not present

## 2023-03-26 DIAGNOSIS — E876 Hypokalemia: Secondary | ICD-10-CM | POA: Diagnosis present

## 2023-03-26 DIAGNOSIS — G629 Polyneuropathy, unspecified: Secondary | ICD-10-CM | POA: Diagnosis present

## 2023-03-26 DIAGNOSIS — R296 Repeated falls: Secondary | ICD-10-CM | POA: Diagnosis present

## 2023-03-26 DIAGNOSIS — E871 Hypo-osmolality and hyponatremia: Secondary | ICD-10-CM | POA: Diagnosis present

## 2023-03-26 DIAGNOSIS — F17210 Nicotine dependence, cigarettes, uncomplicated: Secondary | ICD-10-CM | POA: Diagnosis present

## 2023-03-26 DIAGNOSIS — Z8709 Personal history of other diseases of the respiratory system: Secondary | ICD-10-CM | POA: Diagnosis not present

## 2023-03-26 DIAGNOSIS — J449 Chronic obstructive pulmonary disease, unspecified: Secondary | ICD-10-CM | POA: Diagnosis present

## 2023-03-26 DIAGNOSIS — E559 Vitamin D deficiency, unspecified: Secondary | ICD-10-CM | POA: Diagnosis present

## 2023-03-26 DIAGNOSIS — Z6830 Body mass index (BMI) 30.0-30.9, adult: Secondary | ICD-10-CM

## 2023-03-26 DIAGNOSIS — E669 Obesity, unspecified: Secondary | ICD-10-CM | POA: Diagnosis present

## 2023-03-26 DIAGNOSIS — D72829 Elevated white blood cell count, unspecified: Secondary | ICD-10-CM | POA: Diagnosis present

## 2023-03-26 DIAGNOSIS — W1830XA Fall on same level, unspecified, initial encounter: Secondary | ICD-10-CM | POA: Diagnosis present

## 2023-03-26 DIAGNOSIS — Z720 Tobacco use: Secondary | ICD-10-CM

## 2023-03-26 DIAGNOSIS — Z79899 Other long term (current) drug therapy: Secondary | ICD-10-CM | POA: Diagnosis not present

## 2023-03-26 DIAGNOSIS — R6510 Systemic inflammatory response syndrome (SIRS) of non-infectious origin without acute organ dysfunction: Secondary | ICD-10-CM | POA: Diagnosis not present

## 2023-03-26 DIAGNOSIS — K7460 Unspecified cirrhosis of liver: Secondary | ICD-10-CM | POA: Diagnosis present

## 2023-03-26 DIAGNOSIS — M797 Fibromyalgia: Secondary | ICD-10-CM | POA: Diagnosis present

## 2023-03-26 DIAGNOSIS — F4310 Post-traumatic stress disorder, unspecified: Secondary | ICD-10-CM | POA: Diagnosis present

## 2023-03-26 DIAGNOSIS — Y92009 Unspecified place in unspecified non-institutional (private) residence as the place of occurrence of the external cause: Secondary | ICD-10-CM

## 2023-03-26 DIAGNOSIS — S72001D Fracture of unspecified part of neck of right femur, subsequent encounter for closed fracture with routine healing: Secondary | ICD-10-CM | POA: Diagnosis not present

## 2023-03-26 DIAGNOSIS — G8929 Other chronic pain: Secondary | ICD-10-CM | POA: Diagnosis present

## 2023-03-26 DIAGNOSIS — D62 Acute posthemorrhagic anemia: Secondary | ICD-10-CM | POA: Diagnosis not present

## 2023-03-26 HISTORY — DX: Other complications of anesthesia, initial encounter: T88.59XA

## 2023-03-26 LAB — CBC WITH DIFFERENTIAL/PLATELET
Abs Immature Granulocytes: 0.05 10*3/uL (ref 0.00–0.07)
Basophils Absolute: 0.1 10*3/uL (ref 0.0–0.1)
Basophils Relative: 0 %
Eosinophils Absolute: 0 10*3/uL (ref 0.0–0.5)
Eosinophils Relative: 0 %
HCT: 39.1 % (ref 36.0–46.0)
Hemoglobin: 13.7 g/dL (ref 12.0–15.0)
Immature Granulocytes: 0 %
Lymphocytes Relative: 18 %
Lymphs Abs: 2.1 10*3/uL (ref 0.7–4.0)
MCH: 31.7 pg (ref 26.0–34.0)
MCHC: 35 g/dL (ref 30.0–36.0)
MCV: 90.5 fL (ref 80.0–100.0)
Monocytes Absolute: 1 10*3/uL (ref 0.1–1.0)
Monocytes Relative: 8 %
Neutro Abs: 8.9 10*3/uL — ABNORMAL HIGH (ref 1.7–7.7)
Neutrophils Relative %: 74 %
Platelets: 245 10*3/uL (ref 150–400)
RBC: 4.32 MIL/uL (ref 3.87–5.11)
RDW: 12.7 % (ref 11.5–15.5)
WBC: 12.1 10*3/uL — ABNORMAL HIGH (ref 4.0–10.5)
nRBC: 0 % (ref 0.0–0.2)

## 2023-03-26 LAB — BASIC METABOLIC PANEL
Anion gap: 10 (ref 5–15)
BUN: 8 mg/dL (ref 8–23)
CO2: 21 mmol/L — ABNORMAL LOW (ref 22–32)
Calcium: 8 mg/dL — ABNORMAL LOW (ref 8.9–10.3)
Chloride: 100 mmol/L (ref 98–111)
Creatinine, Ser: 0.38 mg/dL — ABNORMAL LOW (ref 0.44–1.00)
GFR, Estimated: >60 mL/min (ref >60)
Glucose, Bld: 94 mg/dL (ref 70–99)
Potassium: 3.2 mmol/L — ABNORMAL LOW (ref 3.5–5.1)
Sodium: 131 mmol/L — ABNORMAL LOW (ref 135–145)

## 2023-03-26 MED ORDER — FLUTICASONE FUROATE-VILANTEROL 200-25 MCG/ACT IN AEPB
1.0000 | INHALATION_SPRAY | Freq: Every day | RESPIRATORY_TRACT | Status: DC
  Administered 2023-03-27 – 2023-03-31 (×5): 1 via RESPIRATORY_TRACT
  Filled 2023-03-26: qty 28

## 2023-03-26 MED ORDER — TIZANIDINE HCL 4 MG PO TABS
2.0000 mg | ORAL_TABLET | Freq: Three times a day (TID) | ORAL | Status: DC | PRN
  Administered 2023-03-26 – 2023-03-31 (×9): 2 mg via ORAL
  Filled 2023-03-26 (×9): qty 1

## 2023-03-26 MED ORDER — HYDROMORPHONE HCL 1 MG/ML IJ SOLN
1.0000 mg | INTRAMUSCULAR | Status: DC | PRN
  Administered 2023-03-26: 1 mg via INTRAVENOUS
  Filled 2023-03-26: qty 1

## 2023-03-26 MED ORDER — LEVALBUTEROL HCL 1.25 MG/0.5ML IN NEBU: 1.2500 mg | INHALATION_SOLUTION | RESPIRATORY_TRACT | Status: DC | PRN

## 2023-03-26 MED ORDER — UMECLIDINIUM BROMIDE 62.5 MCG/ACT IN AEPB
1.0000 | INHALATION_SPRAY | Freq: Every day | RESPIRATORY_TRACT | Status: DC
  Administered 2023-03-27 – 2023-03-31 (×5): 1 via RESPIRATORY_TRACT
  Filled 2023-03-26: qty 7

## 2023-03-26 MED ORDER — HYDROMORPHONE HCL 1 MG/ML IJ SOLN
1.0000 mg | Freq: Once | INTRAMUSCULAR | Status: AC
  Administered 2023-03-26: 1 mg via INTRAVENOUS
  Filled 2023-03-26: qty 1

## 2023-03-26 MED ORDER — ACETAMINOPHEN 650 MG RE SUPP: 650.0000 mg | Freq: Four times a day (QID) | RECTAL | Status: DC | PRN

## 2023-03-26 MED ORDER — OXYCODONE HCL 5 MG PO TABS
5.0000 mg | ORAL_TABLET | Freq: Four times a day (QID) | ORAL | Status: DC | PRN
  Administered 2023-03-26: 10 mg via ORAL
  Filled 2023-03-26: qty 2

## 2023-03-26 MED ORDER — ACETAMINOPHEN 325 MG PO TABS
650.0000 mg | ORAL_TABLET | Freq: Four times a day (QID) | ORAL | Status: DC | PRN
  Administered 2023-03-26 – 2023-03-28 (×5): 650 mg via ORAL
  Filled 2023-03-26 (×5): qty 2

## 2023-03-26 MED ORDER — GABAPENTIN 300 MG PO CAPS
300.0000 mg | ORAL_CAPSULE | Freq: Three times a day (TID) | ORAL | Status: DC
  Administered 2023-03-27 – 2023-03-30 (×11): 300 mg via ORAL
  Filled 2023-03-26 (×11): qty 1

## 2023-03-26 MED ORDER — POTASSIUM CHLORIDE CRYS ER 20 MEQ PO TBCR
40.0000 meq | EXTENDED_RELEASE_TABLET | Freq: Once | ORAL | Status: AC
  Administered 2023-03-26: 40 meq via ORAL
  Filled 2023-03-26: qty 2

## 2023-03-26 MED ORDER — BUTALBITAL-APAP-CAFFEINE 50-325-40 MG PO TABS
1.0000 | ORAL_TABLET | Freq: Every day | ORAL | Status: DC | PRN
  Administered 2023-03-26: 1 via ORAL
  Filled 2023-03-26: qty 1

## 2023-03-26 MED ORDER — NICOTINE 14 MG/24HR TD PT24
14.0000 mg | MEDICATED_PATCH | Freq: Every day | TRANSDERMAL | Status: DC | PRN
  Administered 2023-03-26 – 2023-03-31 (×6): 14 mg via TRANSDERMAL
  Filled 2023-03-26 (×6): qty 1

## 2023-03-26 MED ORDER — HYDROMORPHONE HCL 1 MG/ML IJ SOLN
1.0000 mg | INTRAMUSCULAR | Status: DC | PRN
  Administered 2023-03-27 (×2): 1 mg via INTRAVENOUS
  Filled 2023-03-26 (×2): qty 1

## 2023-03-26 MED ORDER — OXYCODONE HCL 5 MG PO TABS
10.0000 mg | ORAL_TABLET | ORAL | Status: DC | PRN
  Administered 2023-03-27 (×2): 10 mg via ORAL
  Filled 2023-03-26 (×2): qty 2

## 2023-03-26 MED ORDER — ONDANSETRON HCL 4 MG/2ML IJ SOLN
4.0000 mg | Freq: Four times a day (QID) | INTRAMUSCULAR | Status: DC | PRN
  Administered 2023-03-28: 4 mg via INTRAVENOUS
  Filled 2023-03-26: qty 2

## 2023-03-26 MED ORDER — GABAPENTIN 300 MG PO CAPS
300.0000 mg | ORAL_CAPSULE | Freq: Once | ORAL | Status: AC
  Administered 2023-03-26: 300 mg via ORAL
  Filled 2023-03-26: qty 1

## 2023-03-26 MED ORDER — MELATONIN 3 MG PO TABS
3.0000 mg | ORAL_TABLET | Freq: Every evening | ORAL | Status: DC | PRN
  Administered 2023-03-26 – 2023-03-30 (×5): 3 mg via ORAL
  Filled 2023-03-26 (×5): qty 1

## 2023-03-26 NOTE — ED Triage Notes (Signed)
BIBA for right hip pain after fall.Pt had a right hip fracture 5 weeks ago, has not been to have hip replacement performed yet. Pt fell last night and this AM. 100 mcg fentanyl, 16.5 mg of ketamine PTA. 163/89 BP 41 end tidal 97% room air 98 HR 124 cbg 18g lac

## 2023-03-26 NOTE — H&P (View-Only) (Signed)
Reason for Consult:Right hip fracture Referring Physician: ED  Cassidy Stevens is an 62 y.o. female.  HPI: Cassidy Stevens is a 61 y.o. female with past medical history of fibromyalgia, sciatica, neuropathy, COPD who presents with fall last night and this morning. Reports fall at 2pm from ground height and landed on right hip on the ground. She was trying to reach over her bed when her "feet fell from under her" No head trauma, LOC, dizziness. She presented to [facility] to see Dr Luiz Blare on 5/20 with right hip pain which was diagnosed as right femoral neck fracture. Surgery was held due to a lumbar spine injury as well. Patient reports that her low back is now improved.   Past Medical History:  Diagnosis Date   Arthritis    Cancer (HCC)    melnoma on nose   Cirrhosis (HCC)    COPD (chronic obstructive pulmonary disease) (HCC)    Fibromyalgia    Headache    Hepatitis     Past Surgical History:  Procedure Laterality Date   ABDOMINAL HYSTERECTOMY     FACIAL RECONSTRUCTION SURGERY     KNEE SURGERY      Family History  Problem Relation Age of Onset   Neuropathy Maternal Aunt    Neuropathy Maternal Uncle     Social History:  reports that she has been smoking cigarettes. She has been smoking an average of .5 packs per day. She has never used smokeless tobacco. She reports that she does not currently use alcohol. She reports that she does not use drugs.  Allergies: No Known Allergies  Medications: I have reviewed the patient's current medications.  Results for orders placed or performed during the hospital encounter of 03/26/23 (from the past 48 hour(s))  CBC with Differential     Status: Abnormal   Collection Time: 03/26/23  5:46 PM  Result Value Ref Range   WBC 12.1 (H) 4.0 - 10.5 K/uL   RBC 4.32 3.87 - 5.11 MIL/uL   Hemoglobin 13.7 12.0 - 15.0 g/dL   HCT 16.1 09.6 - 04.5 %   MCV 90.5 80.0 - 100.0 fL   MCH 31.7 26.0 - 34.0 pg   MCHC 35.0 30.0 - 36.0 g/dL    RDW 40.9 81.1 - 91.4 %   Platelets 245 150 - 400 K/uL   nRBC 0.0 0.0 - 0.2 %   Neutrophils Relative % 74 %   Neutro Abs 8.9 (H) 1.7 - 7.7 K/uL   Lymphocytes Relative 18 %   Lymphs Abs 2.1 0.7 - 4.0 K/uL   Monocytes Relative 8 %   Monocytes Absolute 1.0 0.1 - 1.0 K/uL   Eosinophils Relative 0 %   Eosinophils Absolute 0.0 0.0 - 0.5 K/uL   Basophils Relative 0 %   Basophils Absolute 0.1 0.0 - 0.1 K/uL   Immature Granulocytes 0 %   Abs Immature Granulocytes 0.05 0.00 - 0.07 K/uL    Comment: Performed at [facility], [address], [place], [place] [address]  Basic metabolic panel     Status: Abnormal   Collection Time: 03/26/23  5:46 PM  Result Value Ref Range   Sodium 131 (L) 135 - 145 mmol/L   Potassium 3.2 (L) 3.5 - 5.1 mmol/L   Chloride 100 98 - 111 mmol/L   CO2 21 (L) 22 - 32 mmol/L   Glucose, Bld 94 70 - 99 mg/dL    Comment: Glucose reference range applies only to samples taken after fasting for at  least 8 hours.   BUN 8 8 - 23 mg/dL   Creatinine, Ser 1.61 (L) 0.44 - 1.00 mg/dL   Calcium 8.0 (L) 8.9 - 10.3 mg/dL   GFR, Estimated >09 >60 mL/min    Comment: (NOTE) Calculated using the CKD-EPI Creatinine Equation (2021)    Anion gap 10 5 - 15    Comment: Performed at [facility], [address] [place], [place] [address]    DG Chest Portable 1 View  Result Date: 03/26/2023 CLINICAL DATA:  Preop EXAM: PORTABLE CHEST 1 VIEW COMPARISON:  03/05/2023 FINDINGS: Unchanged cardiac and mediastinal contours given AP technique. No new focal pulmonary opacity. No pleural effusion or pneumothorax. No acute osseous abnormality. IMPRESSION: No active disease. Electronically Signed   By: Wiliam Ke M.D.   On: 03/26/2023 19:43   DG Hip Unilat W or Wo Pelvis 2-3 Views Right  Result Date: 03/26/2023 CLINICAL DATA:  Status post fall. EXAM: DG HIP (WITH OR WITHOUT PELVIS) 2-3V RIGHT COMPARISON:  None Available. FINDINGS: There is an acute fracture  deformity extending through the head and neck of the proximal right femur, with approximately 1/2 shaft width dorsal and lateral displacement of the distal fracture site. There is no evidence of dislocation. IMPRESSION: Acute fracture of the proximal right femur. Electronically Signed   By: Aram Candela M.D.   On: 03/26/2023 19:41    Review of Systems  Constitutional:  Negative for chills and fever.  Respiratory:  Negative for cough, shortness of breath and wheezing.   Cardiovascular:  Negative for chest pain and palpitations.  Gastrointestinal:  Negative for abdominal pain.  Musculoskeletal:  Positive for arthralgias and back pain.  Neurological:  Negative for dizziness and headaches.  Psychiatric/Behavioral:  Negative for behavioral problems and confusion.    Blood pressure (!) 166/94, pulse 94, temperature 98.5 F (36.9 C), resp. rate 20, SpO2 96 %. Physical Exam Constitutional:      General: She is not in acute distress.    Appearance: Normal appearance. She is normal weight. She is not ill-appearing.  HENT:     Head: Normocephalic and atraumatic.  Eyes:     Extraocular Movements: Extraocular movements intact.  Cardiovascular:     Pulses: Normal pulses.  Pulmonary:     Effort: Pulmonary effort is normal.  Musculoskeletal:     Cervical back: Normal range of motion.     Comments: Patient able to dorsiflex and plantar flex BLE. Calves soft and nontender. Tender to palpation about the right hip. No open wound or abrasion noted about the right hip. ROM of right hip and knee not performed due to pain. Distally neurovascularly intact in BLE.   Neurological:     Mental Status: She is alert and oriented to person, place, and time. Mental status is at baseline.  Psychiatric:        Mood and Affect: Mood normal.     Assessment/Plan: Right femoral neck fracture Patient originally fractured her right femoral neck about a month ago. At that time it was a minimally displaced valgus  fracture. THA was planned but then postponed due to a lumbar spine issue. Due to the new fall and further displacement of the right femoral neck fracture, the patient does need to move forward with right anterior total hip arthroplasty. Patient to be admitted by medicine. NPO after midnight. Plan for Dr Turner Daniels to perform surgery tomorrow. Oxycodone 5-10mg  q6hrs prn pain ordered as well as Dilaudid 1mg  q3h IV PRN pain not relieved by PO.  Pacey Altizer L. [place], PA-C 03/26/2023, 8:11 PM

## 2023-03-26 NOTE — ED Provider Notes (Signed)
[place] EMERGENCY DEPARTMENT AT [facility] Provider Note   CSN: [id number] Arrival date & time: 03/26/23  1652     History  No chief complaint on file.   Cassidy Stevens is a 62 y.o. female with past medical history of fibromyalgia, sciatica, neuropathy, COPD who presents with fall last night and this morning.  Reports fall at 2pm from ground height and landed on right hip on the ground. She was trying to reach over her bed when her "feet fell from under her" No head trauma, LOC, dizziness.  She recently had a fall with back and hip pain and had to cancel her surgery with orthopedics due to back pain and awaiting MRI.  No nausea, vomiting. Lives with daughter, son and 3 grand kids.      Home Medications Prior to Admission medications   Medication Sig Start Date End Date Taking? Authorizing Provider  albuterol (VENTOLIN HFA) 108 (90 Base) MCG/ACT inhaler Inhale 1-2 puffs into the lungs every 6 (six) hours as needed for wheezing. 05/16/21   [provider]  butalbital-acetaminophen-caffeine (FIORICET) 50-325-40 MG tablet Take 1 tablet by mouth daily as needed for headache. 02/21/23   Blue, Soijett A, PA-C  Fluticasone-Umeclidin-Vilant (TRELEGY ELLIPTA) 200-62.5-25 MCG/INH AEPB Inhale 1 puff into the lungs daily. Patient taking differently: Inhale 1 puff into the lungs daily as needed (wheezing). 05/07/21   Hunsucker, Lesia Sago, MD  gabapentin (NEURONTIN) 300 MG capsule Take 1 capsule (300 mg total) by mouth 3 (three) times daily. 01/10/21   Rancour, Jeannett Senior, MD  gabapentin (NEURONTIN) 600 MG tablet Take 300-600 mg by mouth 3 (three) times daily. 01/10/21   [provider]  HYDROcodone-acetaminophen (NORCO/VICODIN) 5-325 MG tablet Take 1 tablet by mouth every 6 (six) hours as needed for moderate pain. 03/03/23   [provider]  methocarbamol (ROBAXIN) 500 MG tablet Take 500 mg by mouth at bedtime. 11/06/22   [provider]  ondansetron  (ZOFRAN) 4 MG tablet TAKE 1 TABLET BY MOUTH EVERY 8 HOURS AS NEEDED FOR NAUSEA OR VOMITING 11/14/22   Arnaldo Natal, NP  ondansetron (ZOFRAN-ODT) 4 MG disintegrating tablet Take 4 mg by mouth every 8 (eight) hours as needed for nausea or vomiting.    [provider]  tiZANidine (ZANAFLEX) 2 MG tablet Take 2 mg by mouth 3 (three) times daily. 03/10/23   [provider]      Allergies    Patient has no known allergies.    Review of Systems   Review of Systems  Physical Exam Updated Vital Signs BP (!) 166/94   Pulse 94   Temp 98.5 F (36.9 C)   Resp 20   SpO2 96%  Physical Exam Constitutional:      Appearance: Normal appearance.  HENT:     Mouth/Throat:     Comments: Edentulous Eyes:     Extraocular Movements: Extraocular movements intact.     Conjunctiva/sclera: Conjunctivae normal.     Pupils: Pupils are equal, round, and reactive to light.  Cardiovascular:     Rate and Rhythm: Normal rate and regular rhythm.  Pulmonary:     Effort: Pulmonary effort is normal.     Breath sounds: Normal breath sounds.  Abdominal:     General: Abdomen is flat. Bowel sounds are normal.     Palpations: Abdomen is soft.  Musculoskeletal:        General: No swelling.     Comments: Unable to assess right hip range of motion secondary to  patient intolerance of exam secondary to pain.  Distal tibialis pulse easily palpable in bilateral extremities.  Normal ankle flexion extension bilaterally.  Skin:    General: Skin is warm and dry.  Neurological:     General: No focal deficit present.     Mental Status: She is alert and oriented to person, place, and time.     ED Results / Procedures / Treatments   Labs (all labs ordered are listed, but only abnormal results are displayed) Labs Reviewed  CBC WITH DIFFERENTIAL/PLATELET - Abnormal; Notable for the following components:      Result Value   WBC 12.1 (*)    Neutro Abs 8.9 (*)    All other components within normal  limits  BASIC METABOLIC PANEL - Abnormal; Notable for the following components:   Sodium 131 (*)    Potassium 3.2 (*)    CO2 21 (*)    Creatinine, Ser 0.38 (*)    Calcium 8.0 (*)    All other components within normal limits  CBC WITH DIFFERENTIAL/PLATELET  COMPREHENSIVE METABOLIC PANEL  MAGNESIUM  MAGNESIUM    EKG None  Radiology DG Chest Portable 1 View  Result Date: 03/26/2023 CLINICAL DATA:  Preop EXAM: PORTABLE CHEST 1 VIEW COMPARISON:  03/05/2023 FINDINGS: Unchanged cardiac and mediastinal contours given AP technique. No new focal pulmonary opacity. No pleural effusion or pneumothorax. No acute osseous abnormality. IMPRESSION: No active disease. Electronically Signed   By: Wiliam Ke M.D.   On: 03/26/2023 19:43   DG Hip Unilat W or Wo Pelvis 2-3 Views Right  Result Date: 03/26/2023 CLINICAL DATA:  Status post fall. EXAM: DG HIP (WITH OR WITHOUT PELVIS) 2-3V RIGHT COMPARISON:  None Available. FINDINGS: There is an acute fracture deformity extending through the head and neck of the proximal right femur, with approximately 1/2 shaft width dorsal and lateral displacement of the distal fracture site. There is no evidence of dislocation. IMPRESSION: Acute fracture of the proximal right femur. Electronically Signed   By: Aram Candela M.D.   On: 03/26/2023 19:41    Procedures Procedures    Medications Ordered in ED Medications  acetaminophen (TYLENOL) tablet 650 mg (has no administration in time range)    Or  acetaminophen (TYLENOL) suppository 650 mg (has no administration in time range)  melatonin tablet 3 mg (has no administration in time range)  ondansetron (ZOFRAN) injection 4 mg (has no administration in time range)  oxyCODONE (Oxy IR/ROXICODONE) immediate release tablet 5-10 mg (has no administration in time range)  HYDROmorphone (DILAUDID) injection 1 mg (has no administration in time range)  HYDROmorphone (DILAUDID) injection 1 mg (1 mg Intravenous Given 03/26/23  1746)  gabapentin (NEURONTIN) capsule 300 mg (300 mg Oral Given 03/26/23 1845)  HYDROmorphone (DILAUDID) injection 1 mg (1 mg Intravenous Given 03/26/23 1846)    ED Course/ Medical Decision Making/ A&P                             Medical Decision Making 62 year old female with right femoral neck fracture, and recurrent falls.  Was scheduled for total hip arthroplasty on 03/09/2023 with Dr. Luiz Blare at [facility], but patient reportedly canceled due to her concern for new onset back pain and postsurgical rehabilitation. Called and discussed with PA with [facility] who agreed to admission with hospitalist.  Ortho will come and see patient tonight or tomorrow morning, and patient can remain on regular diet for now. Right hip x-ray shows acute  fracture deformity extending to the head and neck of the right femur. Patient has hypokalemia and slight leukocytosis. Will admit to hospitalist.   Amount and/or Complexity of Data Reviewed Labs: ordered. Radiology: ordered.  Risk Prescription drug management. Decision regarding hospitalization.         Final Clinical Impression(s) / ED Diagnoses Final diagnoses:  None    Rx / DC Orders ED Discharge Orders     None         Darral Dash, DO 03/26/23 2045    Maia Plan, MD 03/31/23 [phone]

## 2023-03-26 NOTE — ED Notes (Signed)
ED TO INPATIENT HANDOFF REPORT  ED Nurse Name and Phone #: Deon Pilling [phone]  S Name/Age/Gender Cassidy Stevens 62 y.o. female Room/Bed: WA23/WA23  Code Status   Code Status: Full Code  Home/SNF/Other Home Patient oriented to: self, place, time, and situation Is this baseline? Yes   Triage Complete: Triage complete  Chief Complaint Closed right hip fracture (HCC) [S72.001A]  Triage Note BIBA for right hip pain after fall.Pt had a right hip fracture 5 weeks ago, has not been to have hip replacement performed yet. Pt fell last night and this AM. 100 mcg fentanyl, 16.5 mg of ketamine PTA. 163/89 BP 41 end tidal 97% room air 98 HR 124 cbg 18g lac   Allergies No Known Allergies  Level of Care/Admitting Diagnosis ED Disposition     ED Disposition  Admit   Condition  --   Comment  Hospital Area: [facility] [[id number]]  Level of Care: Telemetry [5]  Admit to tele based on following criteria: Monitor for Ischemic changes  May admit patient to [facility] or [facility] if equivalent level of care is available:: No  Covid Evaluation: Asymptomatic - no recent exposure (last 10 days) testing not required  Diagnosis: Closed right hip fracture [facility]) [[id number]]  Admitting Physician: Angie Fava [[id number]]  Attending Physician: Angie Fava [id number]  Certification:: I certify this patient will need inpatient services for at least 2 midnights  Estimated Length of Stay: 2          B Medical/Surgery History Past Medical History:  Diagnosis Date   Arthritis    Cancer (HCC)    melnoma on nose   Cirrhosis (HCC)    COPD (chronic obstructive pulmonary disease) (HCC)    Fibromyalgia    Headache    Hepatitis    Past Surgical History:  Procedure Laterality Date   ABDOMINAL HYSTERECTOMY     FACIAL RECONSTRUCTION SURGERY     KNEE SURGERY       A IV Location/Drains/Wounds Patient Lines/Drains/Airways Status     Active  Line/Drains/Airways     Name Placement date Placement time Site Days   Peripheral IV 03/26/23 18 G Left Antecubital 03/26/23  1700  Antecubital  less than 1            Intake/Output Last 24 hours No intake or output data in the 24 hours ending 03/26/23 2040  Labs/Imaging Results for orders placed or performed during the hospital encounter of 03/26/23 (from the past 48 hour(s))  CBC with Differential     Status: Abnormal   Collection Time: 03/26/23  5:46 PM  Result Value Ref Range   WBC 12.1 (H) 4.0 - 10.5 K/uL   RBC 4.32 3.87 - 5.11 MIL/uL   Hemoglobin 13.7 12.0 - 15.0 g/dL   HCT 06.2 37.6 - 28.3 %   MCV 90.5 80.0 - 100.0 fL   MCH 31.7 26.0 - 34.0 pg   MCHC 35.0 30.0 - 36.0 g/dL   RDW 15.1 76.1 - 60.7 %   Platelets 245 150 - 400 K/uL   nRBC 0.0 0.0 - 0.2 %   Neutrophils Relative % 74 %   Neutro Abs 8.9 (H) 1.7 - 7.7 K/uL   Lymphocytes Relative 18 %   Lymphs Abs 2.1 0.7 - 4.0 K/uL   Monocytes Relative 8 %   Monocytes Absolute 1.0 0.1 - 1.0 K/uL   Eosinophils Relative 0 %   Eosinophils Absolute 0.0 0.0 - 0.5 K/uL   Basophils Relative 0 %  Basophils Absolute 0.1 0.0 - 0.1 K/uL   Immature Granulocytes 0 %   Abs Immature Granulocytes 0.05 0.00 - 0.07 K/uL    Comment: Performed at [facility], [address], [place], [place] [address]  Basic metabolic panel     Status: Abnormal   Collection Time: 03/26/23  5:46 PM  Result Value Ref Range   Sodium 131 (L) 135 - 145 mmol/L   Potassium 3.2 (L) 3.5 - 5.1 mmol/L   Chloride 100 98 - 111 mmol/L   CO2 21 (L) 22 - 32 mmol/L   Glucose, Bld 94 70 - 99 mg/dL    Comment: Glucose reference range applies only to samples taken after fasting for at least 8 hours.   BUN 8 8 - 23 mg/dL   Creatinine, Ser 4.40 (L) 0.44 - 1.00 mg/dL   Calcium 8.0 (L) 8.9 - 10.3 mg/dL   GFR, Estimated >34 >74 mL/min    Comment: (NOTE) Calculated using the CKD-EPI Creatinine Equation (2021)    Anion gap 10 5 - 15    Comment:  Performed at [facility], [address], [place], [place] [address]   DG Chest Portable 1 View  Result Date: 03/26/2023 CLINICAL DATA:  Preop EXAM: PORTABLE CHEST 1 VIEW COMPARISON:  03/05/2023 FINDINGS: Unchanged cardiac and mediastinal contours given AP technique. No new focal pulmonary opacity. No pleural effusion or pneumothorax. No acute osseous abnormality. IMPRESSION: No active disease. Electronically Signed   By: Wiliam Ke M.D.   On: 03/26/2023 19:43   DG Hip Unilat W or Wo Pelvis 2-3 Views Right  Result Date: 03/26/2023 CLINICAL DATA:  Status post fall. EXAM: DG HIP (WITH OR WITHOUT PELVIS) 2-3V RIGHT COMPARISON:  None Available. FINDINGS: There is an acute fracture deformity extending through the head and neck of the proximal right femur, with approximately 1/2 shaft width dorsal and lateral displacement of the distal fracture site. There is no evidence of dislocation. IMPRESSION: Acute fracture of the proximal right femur. Electronically Signed   By: Aram Candela M.D.   On: 03/26/2023 19:41    Pending Labs Unresulted Labs (From admission, onward)     Start     Ordered   03/27/23 0500  CBC with Differential/Platelet  Tomorrow morning,   R        03/26/23 2007   03/27/23 0500  Comprehensive metabolic panel  Tomorrow morning,   R        03/26/23 2007   03/27/23 0500  Magnesium  Tomorrow morning,   R        03/26/23 2007   03/27/23 0500  Protime-INR  Tomorrow morning,   R        03/26/23 2024   03/27/23 0500  Phosphorus  Tomorrow morning,   R        03/26/23 2027   03/26/23 2024  Urinalysis, Complete w Microscopic -Urine, Clean Catch  Once,   R       Question:  Specimen Source  Answer:  Urine, Clean Catch   03/26/23 2024   03/26/23 2024  Type and screen [facility]  Once,   R       Comments: [facility]    03/26/23 2024   03/26/23 2024  VITAMIN D 25 Hydroxy (Vit-D Deficiency, Fractures)  Add-on,   AD         03/26/23 2024   03/26/23 2008  Magnesium  Add-on,   AD  03/26/23 2007            Vitals/Pain Today's Vitals   03/26/23 1704 03/26/23 1718 03/26/23 1845  BP: (!) 166/94    Pulse: 94    Resp: 20    Temp: 98.5 F (36.9 C)    SpO2: 96%    PainSc:  10-Worst pain ever 10-Worst pain ever    Isolation Precautions No active isolations  Medications Medications  acetaminophen (TYLENOL) tablet 650 mg (has no administration in time range)    Or  acetaminophen (TYLENOL) suppository 650 mg (has no administration in time range)  melatonin tablet 3 mg (has no administration in time range)  ondansetron (ZOFRAN) injection 4 mg (has no administration in time range)  oxyCODONE (Oxy IR/ROXICODONE) immediate release tablet 5-10 mg (10 mg Oral Given 03/26/23 2028)  HYDROmorphone (DILAUDID) injection 1 mg (has no administration in time range)  levalbuterol (XOPENEX) nebulizer solution 1.25 mg (has no administration in time range)  butalbital-acetaminophen-caffeine (FIORICET) 50-325-40 MG per tablet 1 tablet (has no administration in time range)  fluticasone furoate-vilanterol (BREO ELLIPTA) 200-25 MCG/ACT 1 puff (has no administration in time range)    And  umeclidinium bromide (INCRUSE ELLIPTA) 62.5 MCG/ACT 1 puff (has no administration in time range)  gabapentin (NEURONTIN) capsule 300 mg (has no administration in time range)  tiZANidine (ZANAFLEX) tablet 2 mg (has no administration in time range)  nicotine (NICODERM CQ - dosed in mg/24 hours) patch 14 mg (has no administration in time range)  HYDROmorphone (DILAUDID) injection 1 mg (1 mg Intravenous Given 03/26/23 1746)  gabapentin (NEURONTIN) capsule 300 mg (300 mg Oral Given 03/26/23 1845)  HYDROmorphone (DILAUDID) injection 1 mg (1 mg Intravenous Given 03/26/23 1846)  potassium chloride SA (KLOR-CON M) CR tablet 40 mEq (40 mEq Oral Given 03/26/23 2032)    Mobility      Focused Assessments    R Recommendations: See Admitting  Provider Note  Report given to:   Additional Notes:

## 2023-03-26 NOTE — Consult Note (Signed)
Reason for Consult:Right hip fracture Referring Physician: ED  Cassidy Stevens is an 62 y.o. female.  HPI: Cassidy Stevens is a 62 y.o. female with past medical history of fibromyalgia, sciatica, neuropathy, COPD who presents with fall last night and this morning. Reports fall at 2pm from ground height and landed on right hip on the ground. She was trying to reach over her bed when her "feet fell from under her" No head trauma, LOC, dizziness. She presented to [facility] to see Dr Graves on 5/20 with right hip pain which was diagnosed as right femoral neck fracture. Surgery was held due to a lumbar spine injury as well. Patient reports that her low back is now improved.   Past Medical History:  Diagnosis Date   Arthritis    Cancer (HCC)    melnoma on nose   Cirrhosis (HCC)    COPD (chronic obstructive pulmonary disease) (HCC)    Fibromyalgia    Headache    Hepatitis     Past Surgical History:  Procedure Laterality Date   ABDOMINAL HYSTERECTOMY     FACIAL RECONSTRUCTION SURGERY     KNEE SURGERY      Family History  Problem Relation Age of Onset   Neuropathy Maternal Aunt    Neuropathy Maternal Uncle     Social History:  reports that she has been smoking cigarettes. She has been smoking an average of .5 packs per day. She has never used smokeless tobacco. She reports that she does not currently use alcohol. She reports that she does not use drugs.  Allergies: No Known Allergies  Medications: I have reviewed the patient's current medications.  Results for orders placed or performed during the hospital encounter of 03/26/23 (from the past 48 hour(s))  CBC with Differential     Status: Abnormal   Collection Time: 03/26/23  5:46 PM  Result Value Ref Range   WBC 12.1 (H) 4.0 - 10.5 K/uL   RBC 4.32 3.87 - 5.11 MIL/uL   Hemoglobin 13.7 12.0 - 15.0 g/dL   HCT 39.1 36.0 - 46.0 %   MCV 90.5 80.0 - 100.0 fL   MCH 31.7 26.0 - 34.0 pg   MCHC 35.0 30.0 - 36.0 g/dL    RDW 12.7 11.5 - 15.5 %   Platelets 245 150 - 400 K/uL   nRBC 0.0 0.0 - 0.2 %   Neutrophils Relative % 74 %   Neutro Abs 8.9 (H) 1.7 - 7.7 K/uL   Lymphocytes Relative 18 %   Lymphs Abs 2.1 0.7 - 4.0 K/uL   Monocytes Relative 8 %   Monocytes Absolute 1.0 0.1 - 1.0 K/uL   Eosinophils Relative 0 %   Eosinophils Absolute 0.0 0.0 - 0.5 K/uL   Basophils Relative 0 %   Basophils Absolute 0.1 0.0 - 0.1 K/uL   Immature Granulocytes 0 %   Abs Immature Granulocytes 0.05 0.00 - 0.07 K/uL    Comment: Performed at [facility], [address] [place],  [address]  Basic metabolic panel     Status: Abnormal   Collection Time: 03/26/23  5:46 PM  Result Value Ref Range   Sodium 131 (L) 135 - 145 mmol/L   Potassium 3.2 (L) 3.5 - 5.1 mmol/L   Chloride 100 98 - 111 mmol/L   CO2 21 (L) 22 - 32 mmol/L   Glucose, Bld 94 70 - 99 mg/dL    Comment: Glucose reference range applies only to samples taken after fasting for at   least 8 hours.   BUN 8 8 - 23 mg/dL   Creatinine, Ser 0.38 (L) 0.44 - 1.00 mg/dL   Calcium 8.0 (L) 8.9 - 10.3 mg/dL   GFR, Estimated >60 >60 mL/min    Comment: (NOTE) Calculated using the CKD-EPI Creatinine Equation (2021)    Anion gap 10 5 - 15    Comment: Performed at [facility], [address] , [place] [address]    DG Chest Portable 1 View  Result Date: 03/26/2023 CLINICAL DATA:  Preop EXAM: PORTABLE CHEST 1 VIEW COMPARISON:  03/05/2023 FINDINGS: Unchanged cardiac and mediastinal contours given AP technique. No new focal pulmonary opacity. No pleural effusion or pneumothorax. No acute osseous abnormality. IMPRESSION: No active disease. Electronically Signed   By: Alison  Vasan M.D.   On: 03/26/2023 19:43   DG Hip Unilat W or Wo Pelvis 2-3 Views Right  Result Date: 03/26/2023 CLINICAL DATA:  Status post fall. EXAM: DG HIP (WITH OR WITHOUT PELVIS) 2-3V RIGHT COMPARISON:  None Available. FINDINGS: There is an acute fracture  deformity extending through the head and neck of the proximal right femur, with approximately 1/2 shaft width dorsal and lateral displacement of the distal fracture site. There is no evidence of dislocation. IMPRESSION: Acute fracture of the proximal right femur. Electronically Signed   By: Thaddeus  Houston M.D.   On: 03/26/2023 19:41    Review of Systems  Constitutional:  Negative for chills and fever.  Respiratory:  Negative for cough, shortness of breath and wheezing.   Cardiovascular:  Negative for chest pain and palpitations.  Gastrointestinal:  Negative for abdominal pain.  Musculoskeletal:  Positive for arthralgias and back pain.  Neurological:  Negative for dizziness and headaches.  Psychiatric/Behavioral:  Negative for behavioral problems and confusion.    Blood pressure (!) 166/94, pulse 94, temperature 98.5 F (36.9 C), resp. rate 20, SpO2 96 %. Physical Exam Constitutional:      General: She is not in acute distress.    Appearance: Normal appearance. She is normal weight. She is not ill-appearing.  HENT:     Head: Normocephalic and atraumatic.  Eyes:     Extraocular Movements: Extraocular movements intact.  Cardiovascular:     Pulses: Normal pulses.  Pulmonary:     Effort: Pulmonary effort is normal.  Musculoskeletal:     Cervical back: Normal range of motion.     Comments: Patient able to dorsiflex and plantar flex BLE. Calves soft and nontender. Tender to palpation about the right hip. No open wound or abrasion noted about the right hip. ROM of right hip and knee not performed due to pain. Distally neurovascularly intact in BLE.   Neurological:     Mental Status: She is alert and oriented to person, place, and time. Mental status is at baseline.  Psychiatric:        Mood and Affect: Mood normal.     Assessment/Plan: Right femoral neck fracture Patient originally fractured her right femoral neck about a month ago. At that time it was a minimally displaced valgus  fracture. THA was planned but then postponed due to a lumbar spine issue. Due to the new fall and further displacement of the right femoral neck fracture, the patient does need to move forward with right anterior total hip arthroplasty. Patient to be admitted by medicine. NPO after midnight. Plan for Dr Rowan to perform surgery tomorrow. Oxycodone 5-10mg q6hrs prn pain ordered as well as Dilaudid 1mg q3h IV PRN pain not relieved by PO.     Sedona Wenk L. [place], PA-C 03/26/2023, 8:11 PM      

## 2023-03-26 NOTE — H&P (Signed)
History and Physical      Cassidy Stevens WUJ:[id number] DOB: 1961-07-03 DOA: 03/26/2023; DOS: 03/26/2023  PCP: Remus Loffler, PA-C  Patient coming from: home   I have personally briefly reviewed patient's old medical records in [facility]  Chief Complaint: right hip pain  HPI: Cassidy Stevens is a 62 y.o. female with medical history significant for cirrhosis, COPD, chronic hyponatremia associated baseline serum sodium range of 1 28-1 34, who is admitted to [facility] on 03/26/2023 with right hip fracture after presenting from home to [facility] ED complaining of right hip pain.   Patient reports tripping while attempting to ambulate at home, resulting in a ground level fall during which right hip was the principal point of contact with the floor below.  She notes that she was bending over to pick up an item off the floor at which time the aforementioned trip occurred.  As a result of this fall, the patient reports immediate development of sharp right hip pain, with radiation into the right groin. States that this pain has been constant since onset, with exacerbation when attempting to move the right lower extremity.  As a consequence of the associated intensity of this discomfort, reports that he is unable to bear weight on the right lower extremity at this time.  This is relative to baseline functional status in which the patient lives independently as well as baseline ambulatory status in which the patient reports the ability to ambulate without assistance, including no need for support devices.  Otherwise, denies any acute arthralgias or myalgias as a result of the above fall.  Denies any acute numbness or paresthesias in bilateral lower extremities, and confirms that right hip representations a native joint.  She notes that she follows with Dr. Luiz Blare of [facility] orthopedics for a known small right hip fracture, that is not required definitive surgical management up until  this time.   Did not hit head as a component of this fall, and denies any associated loss of consciousness.  Denies any preceding or associated chest pain, shortness of breath, diaphoresis, palpitations, nausea, vomiting, dizziness, presyncope, or syncope.  Denies any subsequent headache, neck pain, blurry vision, or diplopia.  Not on any blood thinners as an outpatient, including no aspirin.  Denies any known history of coronary artery disease or CHF. denies any recent orthopnea, PND, or worsening of peripheral edema.   Denies any recent subjective fever, chills, rigors, or generalized myalgias.  No recent cough, dysuria, or gross hematuria.     ED Course:  Vital signs in the ED were notable for the following: Afebrile; rates in the 90s; blood pressure 166/94; respiratory rate 20, oxygen saturation 96% on room air.  Labs were notable for the following: BMP was notable for sodium 131 compared images to prior serum sodium data point of 128 on 03/11/2023, potassium 3.2, creatinine 0.38, glucose 94.  CBC notable for white blood cell count 12,100, hemoglobin 13.7, platelet count 245.  Per my interpretation, EKG in ED demonstrated the following:  (No EKG performed today).  Imaging and additional notable ED work-up: 1 view chest x-ray, performed radiology read, showed no evidence of acute cardiopulmonary process, including no evidence of infiltrate, edema, or effusion.  Plan films of the right hip, performed radiology, showed acute fracture of the proximal right femur.  EDP discussed the patient's case and imaging with the on-call [place] orthopedic surgeon, Dr. Ave Filter, who recommended admission to the hospitalist service for further evaluation and management of  right  hip fracture.  Dr. Ave Filter to formally consult, and conveyed that it is anticipated the patient will continue to surgery not tomorrow, but rather Saturday, 03/28/2023.  Consequently, Dr. Ave Filter conveys that it is not necessary to make  the patient n.p.o. this evening (from 6/27 into 6/28).   While in the ED, the following were administered: Gabapentin 300 mg p.o. x 1 dose, Dilaudid 1 mg IV x 2 doses.  Subsequently, the patient was admitted for further evaluation and management right hip fracture stemming from ground-level mechanical fall earlier today, with presenting labs also notable for hypokalemia as well as leukocytosis.    Review of Systems: As per HPI otherwise 10 point review of systems negative.   Past Medical History:  Diagnosis Date   Arthritis    Cancer (HCC)    melnoma on nose   Cirrhosis (HCC)    COPD (chronic obstructive pulmonary disease) (HCC)    Fibromyalgia    Headache    Hepatitis     Past Surgical History:  Procedure Laterality Date   ABDOMINAL HYSTERECTOMY     FACIAL RECONSTRUCTION SURGERY     KNEE SURGERY      Social History:  reports that she has been smoking cigarettes. She has been smoking an average of .5 packs per day. She has never used smokeless tobacco. She reports that she does not currently use alcohol. She reports that she does not use drugs.   No Known Allergies  Family History  Problem Relation Age of Onset   Neuropathy Maternal Aunt    Neuropathy Maternal Uncle     Family history reviewed and not pertinent    Prior to Admission medications   Medication Sig Start Date End Date Taking? Authorizing Provider  albuterol (VENTOLIN HFA) 108 (90 Base) MCG/ACT inhaler Inhale 1-2 puffs into the lungs every 6 (six) hours as needed for wheezing. 05/16/21   [provider]  butalbital-acetaminophen-caffeine (FIORICET) 50-325-40 MG tablet Take 1 tablet by mouth daily as needed for headache. 02/21/23   Blue, Soijett A, PA-C  Fluticasone-Umeclidin-Vilant (TRELEGY ELLIPTA) 200-62.5-25 MCG/INH AEPB Inhale 1 puff into the lungs daily. Patient taking differently: Inhale 1 puff into the lungs daily as needed (wheezing). 05/07/21   Hunsucker, Lesia Sago, MD  gabapentin  (NEURONTIN) 300 MG capsule Take 1 capsule (300 mg total) by mouth 3 (three) times daily. 01/10/21   Rancour, Jeannett Senior, MD  gabapentin (NEURONTIN) 600 MG tablet Take 300-600 mg by mouth 3 (three) times daily. 01/10/21   [provider]  HYDROcodone-acetaminophen (NORCO/VICODIN) 5-325 MG tablet Take 1 tablet by mouth every 6 (six) hours as needed for moderate pain. 03/03/23   [provider]  methocarbamol (ROBAXIN) 500 MG tablet Take 500 mg by mouth at bedtime. 11/06/22   [provider]  ondansetron (ZOFRAN) 4 MG tablet TAKE 1 TABLET BY MOUTH EVERY 8 HOURS AS NEEDED FOR NAUSEA OR VOMITING 11/14/22   Arnaldo Natal, NP  ondansetron (ZOFRAN-ODT) 4 MG disintegrating tablet Take 4 mg by mouth every 8 (eight) hours as needed for nausea or vomiting.    [provider]  tiZANidine (ZANAFLEX) 2 MG tablet Take 2 mg by mouth 3 (three) times daily. 03/10/23   [provider]     Objective    Physical Exam: Vitals:   03/26/23 1704  BP: (!) 166/94  Pulse: 94  Resp: 20  Temp: 98.5 F (36.9 C)  SpO2: 96%    General: appears to be stated age; alert, oriented Skin: warm, dry,  no rash Head:  AT/[place] Mouth:  Oral mucosa membranes appear moist, normal dentition Neck: supple; trachea midline Heart:  RRR; did not appreciate any M/R/G Lungs: CTAB, did not appreciate any wheezes, rales, or rhonchi Abdomen: + BS; soft, ND, NT Vascular: 2+ pedal pulses b/l; 2+ radial pulses b/l Extremities: no peripheral edema, no muscle wasting; right lower extremity externally rotated and appearing shorter, grossly, relative to left counterpart Neuro: sensation intact in upper and lower extremities b/l; I deferred assessment of strength involving the right lower extremity the context of presenting right hip fracture.     Labs on Admission: I have personally reviewed following labs and imaging studies  CBC: Recent Labs  Lab 03/26/23 1746  WBC 12.1*  NEUTROABS 8.9*   HGB 13.7  HCT 39.1  MCV 90.5  PLT 245   Basic Metabolic Panel: Recent Labs  Lab 03/26/23 1746  NA 131*  K 3.2*  CL 100  CO2 21*  GLUCOSE 94  BUN 8  CREATININE 0.38*  CALCIUM 8.0*   GFR: CrCl cannot be calculated (Unknown ideal weight.). Liver Function Tests: No results for input(s): "AST", "ALT", "ALKPHOS", "BILITOT", "PROT", "ALBUMIN" in the last 168 hours. No results for input(s): "LIPASE", "AMYLASE" in the last 168 hours. No results for input(s): "AMMONIA" in the last 168 hours. Coagulation Profile: No results for input(s): "INR", "PROTIME" in the last 168 hours. Cardiac Enzymes: No results for input(s): "CKTOTAL", "CKMB", "CKMBINDEX", "TROPONINI" in the last 168 hours. BNP (last 3 results) No results for input(s): "PROBNP" in the last 8760 hours. HbA1C: No results for input(s): "HGBA1C" in the last 72 hours. CBG: No results for input(s): "GLUCAP" in the last 168 hours. Lipid Profile: No results for input(s): "CHOL", "HDL", "LDLCALC", "TRIG", "CHOLHDL", "LDLDIRECT" in the last 72 hours. Thyroid Function Tests: No results for input(s): "TSH", "T4TOTAL", "FREET4", "T3FREE", "THYROIDAB" in the last 72 hours. Anemia Panel: No results for input(s): "VITAMINB12", "FOLATE", "FERRITIN", "TIBC", "IRON", "RETICCTPCT" in the last 72 hours. Urine analysis: No results found for: "COLORURINE", "APPEARANCEUR", "LABSPEC", "PHURINE", "GLUCOSEU", "HGBUR", "BILIRUBINUR", "KETONESUR", "PROTEINUR", "UROBILINOGEN", "NITRITE", "LEUKOCYTESUR"  Radiological Exams on Admission: DG Chest Portable 1 View  Result Date: 03/26/2023 CLINICAL DATA:  Preop EXAM: PORTABLE CHEST 1 VIEW COMPARISON:  03/05/2023 FINDINGS: Unchanged cardiac and mediastinal contours given AP technique. No new focal pulmonary opacity. No pleural effusion or pneumothorax. No acute osseous abnormality. IMPRESSION: No active disease. Electronically Signed   By: Wiliam Ke M.D.   On: 03/26/2023 19:43   DG Hip Unilat W or  Wo Pelvis 2-3 Views Right  Result Date: 03/26/2023 CLINICAL DATA:  Status post fall. EXAM: DG HIP (WITH OR WITHOUT PELVIS) 2-3V RIGHT COMPARISON:  None Available. FINDINGS: There is an acute fracture deformity extending through the head and neck of the proximal right femur, with approximately 1/2 shaft width dorsal and lateral displacement of the distal fracture site. There is no evidence of dislocation. IMPRESSION: Acute fracture of the proximal right femur. Electronically Signed   By: Aram Candela M.D.   On: 03/26/2023 19:41      Assessment/Plan    Principal Problem:   Closed right hip fracture (HCC) Active Problems:   Hypokalemia   Fall at home, initial encounter   Leukocytosis   SIRS (systemic inflammatory response syndrome) (HCC)   Cirrhosis (HCC)   History of COPD   Tobacco abuse   Chronic hyponatremia      #) Acute right hip fracture: confirmed via presenting plain films and stemming from ground level mechanical fall  without associated loss of consciousness that occurred earlier on the day of admission, as further described above, resulting in immediate develop of acute right hip pain.  At this time, the right lower extremity appears neurovascularly intact, and patient reports adequate pain control. Not on any blood thinners at home, including no aspirin.   EDP discussed the patient's case and imaging with the on-call [place] orthopedic surgeon, Dr. Ave Filter, who recommended admission to the hospitalist service for further evaluation and management of right  hip fracture.  Dr. Ave Filter to formally consult, and conveyed that it is anticipated the patient will continue to surgery not tomorrow, but rather Saturday, 03/28/2023.  Consequently, Dr. Ave Filter conveys that it is not necessary to make the patient n.p.o. this evening (from 6/27 into 6/28).   Chales Abrahams Score for this patient in the context of anticipated aforementioned orthopedic surgery conveys a  0.58% perioperative risk  for significant cardiac event. No evidence to suggest acutely decompensated heart failure or acute MI. Consequently, no absolute contraindications to proceeding with proposed orthopedic surgery at this time.   Plan: Formal orthopedic surgery consult for definitive surgical management, with plan to take patient to the OR on Saturday, 6/29. No pharmacologic anticoagulation leading up to this anticipated surgery. SCD's. Prn IV Dilaudid. Anticipate postoperative PT consult. Check INR. Pre-op EKG has been ordered. Check 25-hydroxy vit D level to assess for any underlying pathological contribution towards the patient's fracture stemming from associated vitamin deficiency. Type and screen ordered.  Resume outpatient prn Zanaflex.              #) Ground level mechanical fall: The patient reports a ground level mechanical fall earlier today in which she tripped without any associated loss of consciousness. Appears to be purely mechanical in nature, without clinical evidence at this time to suggest contributory dizziness, presyncope, syncope, or acute ischemic CVA. Does not appear to have hit head as component of this fall. presentation does not appear to be associated any acute neurologic deficits.  While this fall appears to be purely mechanical in nature, will also check urinalysis to evaluate for any underlying infectious contribution.   Plan: Check urinalysis, as above.  CMP and CBC with differential in the morning. Fall precautions. Anticipate postoperative PT consult.                #) Leukocytosis: Presenting CBC reflects mildly elevated white cell count of 12,100. Suspect that this is reactive in nature in the setting of presenting ground-level mechanical fall as well as associated acute hip fracture.  No evidence to suggest underlying infectious process at this time, including chest x-ray, which shows no evidence of acute cardiopulmonary process, including no evidence of infiltrate.   Additionally, SIRS criteria met via concomitant mild tachycardia, which is suspected to be present as compensatory mechanism in the context of her presenting initial suboptimal pain control relating to her presenting right hip fracture. will check urinalysis to further evaluate for any underlying infectious contribution.  Appears hemodynamically stable.  In the context of no evidence of underlying infectious process at this time, criteria for sepsis are not currently met, and will refrain from empiric initiation of antibiotics at this time.  Plan: Check urinalysis, as above.  Repeat CBC with diff in the morning.  Monitor strict I's and O's and daily weights.                 #) Hypokalemia: presenting potassium level noted to be 3.2.    Plan: monitor on tele.  KCl 40 meq p.o. x 1 dose now. Add-on serum mag level. CMP, mag level in the AM.                #) Cirrhosis: Documented history of such, with etiology of underlying cirrhosis no entirely clear to me at the present time.  No documentation of concomitant portal hypertension, and it is noted that the patient is not on a diuretic medications as an outpatient, including no Lasix or spironolactone.  Does not appear to be associated with any chronic pancytopenia.  Unable to calculate MELD score at this time in the absence of results of total bilirubin and INR.   Plan: Monitor strict I's and O's and daily weights.  Repeat CMP, CBC, and INR in the AM. Check serum mag level.                   #) COPD: Documented history thereof, without clinical evidence of acute exacerbation at this time.  Outpatient respiratory regimen includes the following: Trelegy and prn albuterol.  Will proceed with Xopenex as prn beta-2 agonists given initial tachycardia.  Plan: cont outpatient  Trelegy. Prn Xopenex nebulizer. Check CMP and serum magnesium level in the AM.  Additionally, will check serum phosphorus level in order to further  optimize her respiratory status leading up to anticipated surgery regarding her right hip fracture.                #) Chronic tobacco abuse: Patient conveys that they are a current smoker, having smoked 0.5 ppd for greater than 10 years.   Plan: Counseled the patient for less than 2 minutes on the importance of complete smoking discontinuation.  Order placed for prn nicotine patch for use during this hospitalization.                    #) Chronic hyponatremia: Documented history of such, with baseline serum sodium range of 1 28-1 34 dating back to December 2023, with presenting sodium of 131 consistent with this range, trending up slightly from most recent prior value as further quantified above.  Suspect contribution from documented history of cirrhosis, without overt evidence of acute volume overload at this time, including chest x-ray shows no evidence of acute cardiopulmonary process, including no evidence of pulmonary edema or pleural effusion.  As the patient's hyponatremia appears chronic in nature, with presenting serum sodium within baseline range, will not pursue aggressive additional inpatient workup of this finding at the present time.  Plan: Monitor strict I's and O's Daily weights.  Check INR.  CMP in the morning.        DVT prophylaxis: SCD's   Code Status: Full code Family Communication: none Disposition Plan: Per Rounding Team Consults called: EDP d/w on-call [place] orthopedics, Dr. Ave Filter, As further detailed above;  Admission status: inpatient    I SPENT GREATER THAN 75  MINUTES IN CLINICAL CARE TIME/MEDICAL DECISION-MAKING IN COMPLETING THIS ADMISSION.     Chaney Born Dezaray Shibuya DO [facility] From 7PM - 7AM   03/26/2023, 8:36 PM

## 2023-03-26 NOTE — ED Notes (Signed)
Pt asked staff if she could take her own Gabapentin. Staff informed pt that she couldn't take personal medication and staff would let the nurse and MD know pt is asking for some.

## 2023-03-27 ENCOUNTER — Encounter (HOSPITAL_COMMUNITY)
Admission: EM | Disposition: A | Discharge status: Skilled Nursing Facility | Payer: Self-pay | Source: Home / Self Care | Attending: Internal Medicine

## 2023-03-27 ENCOUNTER — Other Ambulatory Visit: Payer: Self-pay

## 2023-03-27 ENCOUNTER — Inpatient Hospital Stay (HOSPITAL_COMMUNITY): Payer: Medicaid Other | Admitting: Certified Registered Nurse Anesthetist

## 2023-03-27 ENCOUNTER — Inpatient Hospital Stay (HOSPITAL_COMMUNITY): Payer: Medicaid Other

## 2023-03-27 ENCOUNTER — Encounter (HOSPITAL_COMMUNITY): Payer: Self-pay | Admitting: Internal Medicine

## 2023-03-27 DIAGNOSIS — E871 Hypo-osmolality and hyponatremia: Secondary | ICD-10-CM | POA: Diagnosis not present

## 2023-03-27 DIAGNOSIS — J449 Chronic obstructive pulmonary disease, unspecified: Secondary | ICD-10-CM | POA: Diagnosis not present

## 2023-03-27 DIAGNOSIS — F17210 Nicotine dependence, cigarettes, uncomplicated: Secondary | ICD-10-CM | POA: Diagnosis not present

## 2023-03-27 DIAGNOSIS — S72001D Fracture of unspecified part of neck of right femur, subsequent encounter for closed fracture with routine healing: Secondary | ICD-10-CM | POA: Diagnosis not present

## 2023-03-27 DIAGNOSIS — S72001A Fracture of unspecified part of neck of right femur, initial encounter for closed fracture: Secondary | ICD-10-CM

## 2023-03-27 HISTORY — PX: TOTAL HIP ARTHROPLASTY: SHX124

## 2023-03-27 LAB — URINALYSIS, COMPLETE (UACMP) WITH MICROSCOPIC
Bilirubin Urine: NEGATIVE
Glucose, UA: NEGATIVE mg/dL
Hgb urine dipstick: NEGATIVE
Ketones, ur: NEGATIVE mg/dL
Nitrite: NEGATIVE
Protein, ur: NEGATIVE mg/dL
Specific Gravity, Urine: 1.006 (ref 1.005–1.030)
pH: 7 (ref 5.0–8.0)

## 2023-03-27 LAB — PHOSPHORUS: Phosphorus: 4.9 mg/dL — ABNORMAL HIGH (ref 2.5–4.6)

## 2023-03-27 LAB — COMPREHENSIVE METABOLIC PANEL
ALT: 15 U/L (ref 0–44)
AST: 15 U/L (ref 15–41)
Albumin: 4 g/dL (ref 3.5–5.0)
Alkaline Phosphatase: 129 U/L — ABNORMAL HIGH (ref 38–126)
Anion gap: 9 (ref 5–15)
BUN: 5 mg/dL — ABNORMAL LOW (ref 8–23)
CO2: 22 mmol/L (ref 22–32)
Calcium: 9.1 mg/dL (ref 8.9–10.3)
Chloride: 101 mmol/L (ref 98–111)
Creatinine, Ser: 0.51 mg/dL (ref 0.44–1.00)
GFR, Estimated: >60 mL/min (ref >60)
Glucose, Bld: 111 mg/dL — ABNORMAL HIGH (ref 70–99)
Potassium: 4.2 mmol/L (ref 3.5–5.1)
Sodium: 132 mmol/L — ABNORMAL LOW (ref 135–145)
Total Bilirubin: 0.7 mg/dL (ref 0.3–1.2)
Total Protein: 7.6 g/dL (ref 6.5–8.1)

## 2023-03-27 LAB — ABO/RH: ABO/RH(D): A POS

## 2023-03-27 LAB — CBC WITH DIFFERENTIAL/PLATELET
Abs Immature Granulocytes: 0.04 10*3/uL (ref 0.00–0.07)
Basophils Absolute: 0 10*3/uL (ref 0.0–0.1)
Basophils Relative: 0 %
Eosinophils Absolute: 0.2 10*3/uL (ref 0.0–0.5)
Eosinophils Relative: 2 %
HCT: 41.5 % (ref 36.0–46.0)
Hemoglobin: 14.5 g/dL (ref 12.0–15.0)
Immature Granulocytes: 0 %
Lymphocytes Relative: 29 %
Lymphs Abs: 2.6 10*3/uL (ref 0.7–4.0)
MCH: 31.5 pg (ref 26.0–34.0)
MCHC: 34.9 g/dL (ref 30.0–36.0)
MCV: 90.2 fL (ref 80.0–100.0)
Monocytes Absolute: 0.9 10*3/uL (ref 0.1–1.0)
Monocytes Relative: 10 %
Neutro Abs: 5.3 10*3/uL (ref 1.7–7.7)
Neutrophils Relative %: 59 %
Platelets: 264 10*3/uL (ref 150–400)
RBC: 4.6 MIL/uL (ref 3.87–5.11)
RDW: 12.6 % (ref 11.5–15.5)
WBC: 9.2 10*3/uL (ref 4.0–10.5)
nRBC: 0 % (ref 0.0–0.2)

## 2023-03-27 LAB — SURGICAL PCR SCREEN
MRSA, PCR: NEGATIVE
Staphylococcus aureus: NEGATIVE

## 2023-03-27 LAB — TYPE AND SCREEN
ABO/RH(D): A POS
Antibody Screen: NEGATIVE

## 2023-03-27 LAB — MAGNESIUM: Magnesium: 2 mg/dL (ref 1.7–2.4)

## 2023-03-27 LAB — VITAMIN D 25 HYDROXY (VIT D DEFICIENCY, FRACTURES): Vit D, 25-Hydroxy: 9.67 ng/mL — ABNORMAL LOW (ref 30–100)

## 2023-03-27 LAB — PROTIME-INR
INR: 1 (ref 0.8–1.2)
Prothrombin Time: 13 seconds (ref 11.4–15.2)

## 2023-03-27 SURGERY — ARTHROPLASTY, HIP, TOTAL, ANTERIOR APPROACH
Anesthesia: Spinal | Site: Hip | Laterality: Right

## 2023-03-27 MED ORDER — TRANEXAMIC ACID-NACL 1000-0.7 MG/100ML-% IV SOLN
1000.0000 mg | Freq: Once | INTRAVENOUS | Status: AC
  Administered 2023-03-27: 1000 mg via INTRAVENOUS
  Filled 2023-03-27: qty 100

## 2023-03-27 MED ORDER — FENTANYL CITRATE (PF) 250 MCG/5ML IJ SOLN
INTRAMUSCULAR | Status: AC
  Filled 2023-03-27: qty 5

## 2023-03-27 MED ORDER — TRANEXAMIC ACID 1000 MG/10ML IV SOLN
2000.0000 mg | Freq: Once | INTRAVENOUS | Status: AC
  Administered 2023-03-27: 2000 mg via TOPICAL
  Filled 2023-03-27: qty 20

## 2023-03-27 MED ORDER — METOCLOPRAMIDE HCL 5 MG/ML IJ SOLN: 5.0000 mg | Freq: Three times a day (TID) | INTRAMUSCULAR | Status: DC | PRN

## 2023-03-27 MED ORDER — ACETAMINOPHEN 10 MG/ML IV SOLN
1000.0000 mg | Freq: Once | INTRAVENOUS | Status: DC | PRN
  Administered 2023-03-27: 1000 mg via INTRAVENOUS

## 2023-03-27 MED ORDER — PHENYLEPHRINE 80 MCG/ML (10ML) SYRINGE FOR IV PUSH (FOR BLOOD PRESSURE SUPPORT)
PREFILLED_SYRINGE | INTRAVENOUS | Status: DC | PRN
  Administered 2023-03-27 (×3): 160 ug via INTRAVENOUS

## 2023-03-27 MED ORDER — BUPIVACAINE HCL (PF) 0.5 % IJ SOLN
INTRAMUSCULAR | Status: DC | PRN
  Administered 2023-03-27: 11 mg via INTRATHECAL

## 2023-03-27 MED ORDER — PROPOFOL 500 MG/50ML IV EMUL
INTRAVENOUS | Status: AC
  Filled 2023-03-27: qty 50

## 2023-03-27 MED ORDER — ALUM & MAG HYDROXIDE-SIMETH 200-200-20 MG/5ML PO SUSP: 30.0000 mL | ORAL | Status: DC | PRN

## 2023-03-27 MED ORDER — ONDANSETRON HCL 4 MG/2ML IJ SOLN: 4.0000 mg | Freq: Four times a day (QID) | INTRAMUSCULAR | Status: DC | PRN

## 2023-03-27 MED ORDER — FENTANYL CITRATE PF 50 MCG/ML IJ SOSY
25.0000 ug | PREFILLED_SYRINGE | INTRAMUSCULAR | Status: DC | PRN
  Administered 2023-03-27 (×2): 50 ug via INTRAVENOUS

## 2023-03-27 MED ORDER — BUPIVACAINE LIPOSOME 1.3 % IJ SUSP
INTRAMUSCULAR | Status: AC
  Filled 2023-03-27: qty 10

## 2023-03-27 MED ORDER — PHENYLEPHRINE HCL-NACL 20-0.9 MG/250ML-% IV SOLN
INTRAVENOUS | Status: DC | PRN
  Administered 2023-03-27: 25 ug/min via INTRAVENOUS

## 2023-03-27 MED ORDER — LACTATED RINGERS IV SOLN: INTRAVENOUS | Status: DC

## 2023-03-27 MED ORDER — ONDANSETRON HCL 4 MG/2ML IJ SOLN: 4.0000 mg | Freq: Once | INTRAMUSCULAR | Status: DC | PRN

## 2023-03-27 MED ORDER — PROPOFOL 10 MG/ML IV BOLUS
INTRAVENOUS | Status: AC
  Filled 2023-03-27: qty 20

## 2023-03-27 MED ORDER — MIDAZOLAM HCL 2 MG/2ML IJ SOLN
INTRAMUSCULAR | Status: DC | PRN
  Administered 2023-03-27 (×2): 1 mg via INTRAVENOUS

## 2023-03-27 MED ORDER — DOCUSATE SODIUM 100 MG PO CAPS
100.0000 mg | ORAL_CAPSULE | Freq: Two times a day (BID) | ORAL | Status: DC
  Administered 2023-03-27 – 2023-03-31 (×8): 100 mg via ORAL
  Filled 2023-03-27 (×8): qty 1

## 2023-03-27 MED ORDER — HYDROMORPHONE HCL 1 MG/ML IJ SOLN
1.0000 mg | INTRAMUSCULAR | Status: DC | PRN
  Administered 2023-03-27 – 2023-03-30 (×8): 1 mg via INTRAVENOUS
  Filled 2023-03-27 (×8): qty 1

## 2023-03-27 MED ORDER — BUPIVACAINE HCL (PF) 0.25 % IJ SOLN
INTRAMUSCULAR | Status: AC
  Filled 2023-03-27: qty 30

## 2023-03-27 MED ORDER — METHOCARBAMOL 1000 MG/10ML IJ SOLN: 500.0000 mg | Freq: Four times a day (QID) | INTRAVENOUS | Status: DC | PRN

## 2023-03-27 MED ORDER — CEFAZOLIN SODIUM-DEXTROSE 2-4 GM/100ML-% IV SOLN
2.0000 g | INTRAVENOUS | Status: AC
  Administered 2023-03-27: 2 g via INTRAVENOUS
  Filled 2023-03-27: qty 100

## 2023-03-27 MED ORDER — HYDROMORPHONE HCL 1 MG/ML IJ SOLN
1.0000 mg | INTRAMUSCULAR | Status: DC | PRN
  Administered 2023-03-27 (×2): 1 mg via INTRAVENOUS
  Filled 2023-03-27 (×2): qty 1

## 2023-03-27 MED ORDER — FENTANYL CITRATE PF 50 MCG/ML IJ SOSY
PREFILLED_SYRINGE | INTRAMUSCULAR | Status: AC
  Filled 2023-03-27: qty 1

## 2023-03-27 MED ORDER — PHENYLEPHRINE HCL (PRESSORS) 10 MG/ML IV SOLN
INTRAVENOUS | Status: AC
  Filled 2023-03-27: qty 1

## 2023-03-27 MED ORDER — LIDOCAINE HCL (PF) 2 % IJ SOLN
INTRAMUSCULAR | Status: AC
  Filled 2023-03-27: qty 5

## 2023-03-27 MED ORDER — DIAZEPAM 5 MG PO TABS: 5.0000 mg | ORAL_TABLET | Freq: Two times a day (BID) | ORAL | Status: DC | PRN

## 2023-03-27 MED ORDER — ASPIRIN 81 MG PO CHEW
81.0000 mg | CHEWABLE_TABLET | Freq: Two times a day (BID) | ORAL | Status: DC
  Administered 2023-03-28 – 2023-03-31 (×7): 81 mg via ORAL
  Filled 2023-03-27 (×7): qty 1

## 2023-03-27 MED ORDER — FENTANYL CITRATE (PF) 100 MCG/2ML IJ SOLN
INTRAMUSCULAR | Status: DC | PRN
  Administered 2023-03-27: 50 ug via INTRAVENOUS

## 2023-03-27 MED ORDER — BISACODYL 5 MG PO TBEC: 5.0000 mg | DELAYED_RELEASE_TABLET | Freq: Every day | ORAL | Status: DC | PRN

## 2023-03-27 MED ORDER — DIPHENHYDRAMINE HCL 12.5 MG/5ML PO ELIX
12.5000 mg | ORAL_SOLUTION | ORAL | Status: DC | PRN
  Administered 2023-03-27 – 2023-03-30 (×4): 25 mg via ORAL
  Filled 2023-03-27 (×4): qty 10

## 2023-03-27 MED ORDER — VITAMIN D (ERGOCALCIFEROL) 1.25 MG (50000 UNIT) PO CAPS
50000.0000 [IU] | ORAL_CAPSULE | ORAL | Status: DC
  Administered 2023-03-27: 50000 [IU] via ORAL
  Filled 2023-03-27: qty 1

## 2023-03-27 MED ORDER — MIDAZOLAM HCL 2 MG/2ML IJ SOLN
INTRAMUSCULAR | Status: AC
  Filled 2023-03-27: qty 2

## 2023-03-27 MED ORDER — ONDANSETRON HCL 4 MG/2ML IJ SOLN
INTRAMUSCULAR | Status: AC
  Filled 2023-03-27: qty 2

## 2023-03-27 MED ORDER — BUPIVACAINE-EPINEPHRINE 0.25% -1:200000 IJ SOLN
INTRAMUSCULAR | Status: DC | PRN
  Administered 2023-03-27: 30 mL

## 2023-03-27 MED ORDER — PHENYLEPHRINE 80 MCG/ML (10ML) SYRINGE FOR IV PUSH (FOR BLOOD PRESSURE SUPPORT)
PREFILLED_SYRINGE | INTRAVENOUS | Status: AC
  Filled 2023-03-27: qty 10

## 2023-03-27 MED ORDER — ONDANSETRON HCL 4 MG/2ML IJ SOLN
INTRAMUSCULAR | Status: DC | PRN
  Administered 2023-03-27: 4 mg via INTRAVENOUS

## 2023-03-27 MED ORDER — ACETAMINOPHEN 10 MG/ML IV SOLN
INTRAVENOUS | Status: AC
  Filled 2023-03-27: qty 100

## 2023-03-27 MED ORDER — TRANEXAMIC ACID-NACL 1000-0.7 MG/100ML-% IV SOLN
1000.0000 mg | INTRAVENOUS | Status: AC
  Administered 2023-03-27: 1000 mg via INTRAVENOUS
  Filled 2023-03-27: qty 100

## 2023-03-27 MED ORDER — PROPOFOL 10 MG/ML IV BOLUS
INTRAVENOUS | Status: DC | PRN
  Administered 2023-03-27 (×2): 30 mg via INTRAVENOUS
  Administered 2023-03-27: 20 mg via INTRAVENOUS

## 2023-03-27 MED ORDER — METOCLOPRAMIDE HCL 5 MG PO TABS: 5.0000 mg | ORAL_TABLET | Freq: Three times a day (TID) | ORAL | Status: DC | PRN

## 2023-03-27 MED ORDER — BUPIVACAINE LIPOSOME 1.3 % IJ SUSP
INTRAMUSCULAR | Status: DC | PRN
  Administered 2023-03-27: 10 mL

## 2023-03-27 MED ORDER — 0.9 % SODIUM CHLORIDE (POUR BTL) OPTIME
TOPICAL | Status: DC | PRN
  Administered 2023-03-27: 1000 mL

## 2023-03-27 MED ORDER — ROCURONIUM BROMIDE 10 MG/ML (PF) SYRINGE
PREFILLED_SYRINGE | INTRAVENOUS | Status: AC
  Filled 2023-03-27: qty 10

## 2023-03-27 MED ORDER — OXYCODONE HCL 5 MG PO TABS
10.0000 mg | ORAL_TABLET | ORAL | Status: DC | PRN
  Administered 2023-03-27 – 2023-03-31 (×21): 10 mg via ORAL
  Filled 2023-03-27 (×21): qty 2

## 2023-03-27 MED ORDER — PHENOL 1.4 % MT LIQD: 1.0000 | OROMUCOSAL | Status: DC | PRN

## 2023-03-27 MED ORDER — ONDANSETRON HCL 4 MG PO TABS: 4.0000 mg | ORAL_TABLET | Freq: Four times a day (QID) | ORAL | Status: DC | PRN

## 2023-03-27 MED ORDER — MUPIROCIN 2 % EX OINT: 1.0000 | TOPICAL_OINTMENT | Freq: Two times a day (BID) | CUTANEOUS | Status: DC

## 2023-03-27 MED ORDER — SODIUM CHLORIDE (PF) 0.9 % IJ SOLN
INTRAMUSCULAR | Status: DC | PRN
  Administered 2023-03-27: 50 mL

## 2023-03-27 MED ORDER — EPINEPHRINE PF 1 MG/ML IJ SOLN
INTRAMUSCULAR | Status: AC
  Filled 2023-03-27: qty 1

## 2023-03-27 MED ORDER — PROPOFOL 500 MG/50ML IV EMUL
INTRAVENOUS | Status: DC | PRN
  Administered 2023-03-27: 75 ug/kg/min via INTRAVENOUS

## 2023-03-27 MED ORDER — CHLORHEXIDINE GLUCONATE 0.12 % MT SOLN: 15.0000 mL | Freq: Once | OROMUCOSAL | Status: DC

## 2023-03-27 MED ORDER — MENTHOL 3 MG MT LOZG: 1.0000 | LOZENGE | OROMUCOSAL | Status: DC | PRN

## 2023-03-27 MED ORDER — METHOCARBAMOL 500 MG PO TABS
500.0000 mg | ORAL_TABLET | Freq: Four times a day (QID) | ORAL | Status: DC | PRN
  Administered 2023-03-27 – 2023-03-29 (×7): 500 mg via ORAL
  Filled 2023-03-27 (×9): qty 1

## 2023-03-27 MED ORDER — SODIUM CHLORIDE (PF) 0.9 % IJ SOLN
INTRAMUSCULAR | Status: AC
  Filled 2023-03-27: qty 50

## 2023-03-27 SURGICAL SUPPLY — 45 items
ASMB CBL PN 914X1.8 STRL (Trauma Fixation) ×1 IMPLANT
BAG COUNTER SPONGE SURGICOUNT (BAG) ×1 IMPLANT
BAG DECANTER FOR FLEXI CONT (MISCELLANEOUS) ×2 IMPLANT
BAG SPNG CNTER NS LX DISP (BAG) ×1
BALL HIP CERAMIC (Hips) IMPLANT
BLADE SAW SGTL 18X1.27X75 (BLADE) ×1 IMPLANT
CABLE READY CERCLAGE W/CRIP (Trauma Fixation) IMPLANT
CLSR STERI-STRIP ANTIMIC 1/2X4 (GAUZE/BANDAGES/DRESSINGS) IMPLANT
COVER PERINEAL POST (MISCELLANEOUS) ×1 IMPLANT
COVER SURGICAL LIGHT HANDLE (MISCELLANEOUS) ×1 IMPLANT
CUP SECTOR GRIPTON 50MM (Cup) IMPLANT
DRAPE STERI IOBAN 125X83 (DRAPES) ×1 IMPLANT
DRAPE U-SHAPE 47X51 STRL (DRAPES) ×2 IMPLANT
DRSG AQUACEL AG ADV 3.5X10 (GAUZE/BANDAGES/DRESSINGS) ×1 IMPLANT
DURAPREP 26ML APPLICATOR (WOUND CARE) ×1 IMPLANT
ELECT BLADE TIP CTD 4 INCH (ELECTRODE) ×1 IMPLANT
ELECT REM PT RETURN 15FT ADLT (MISCELLANEOUS) ×1 IMPLANT
GLOVE BIO SURGEON STRL SZ7.5 (GLOVE) ×1 IMPLANT
GLOVE BIO SURGEON STRL SZ8.5 (GLOVE) ×1 IMPLANT
GLOVE BIOGEL PI IND STRL 8 (GLOVE) ×1 IMPLANT
GLOVE BIOGEL PI IND STRL 9 (GLOVE) ×1 IMPLANT
GOWN STRL REUS W/ TWL XL LVL3 (GOWN DISPOSABLE) ×2 IMPLANT
GOWN STRL REUS W/TWL XL LVL3 (GOWN DISPOSABLE) ×2
HIP BALL CERAMIC (Hips) ×1 IMPLANT
HOLDER FOLEY CATH W/STRAP (MISCELLANEOUS) ×1 IMPLANT
KIT TURNOVER KIT A (KITS) IMPLANT
LINER ACET PNNCL PLUS4 NEUTRAL (Hips) IMPLANT
MANIFOLD NEPTUNE II (INSTRUMENTS) ×1 IMPLANT
NDL HYPO 21X1.5 SAFETY (NEEDLE) ×2 IMPLANT
NEEDLE HYPO 21X1.5 SAFETY (NEEDLE) ×2 IMPLANT
NS IRRIG 1000ML POUR BTL (IV SOLUTION) ×1 IMPLANT
PACK ANTERIOR HIP CUSTOM (KITS) ×1 IMPLANT
PINNACLE PLUS 4 NEUTRAL (Hips) ×1 IMPLANT
SPIKE FLUID TRANSFER (MISCELLANEOUS) ×1 IMPLANT
STEM FEMORAL SZ 5MM STD ACTIS (Stem) IMPLANT
SUT VIC AB 0 CT1 27 (SUTURE) ×1
SUT VIC AB 0 CT1 27XBRD ANBCTR (SUTURE) ×1 IMPLANT
SUT VIC AB 1 CTX 36 (SUTURE) ×1
SUT VIC AB 1 CTX36XBRD ANBCTR (SUTURE) ×1 IMPLANT
SUT VIC AB 2-0 CT1 27 (SUTURE) ×1
SUT VIC AB 2-0 CT1 TAPERPNT 27 (SUTURE) ×1 IMPLANT
SUT VICRYL+ 3-0 36IN CT-1 (SUTURE) ×1 IMPLANT
SYR CONTROL 10ML LL (SYRINGE) ×3 IMPLANT
TRAY FOLEY MTR SLVR 16FR STAT (SET/KITS/TRAYS/PACK) IMPLANT
TUBE SUCTION HIGH CAP CLEAR NV (SUCTIONS) ×1 IMPLANT

## 2023-03-27 NOTE — Progress Notes (Signed)
Pt returned to room 1343 via bed after surgery. Received report from [place], RN in PACU. See focused reassessment. Will continue to monitor.

## 2023-03-27 NOTE — Anesthesia Procedure Notes (Signed)
Procedure Name: MAC Date/Time: 03/27/2023 12:55 PM  Performed by: Elyn Peers, CRNAPre-anesthesia Checklist: Patient identified, Emergency Drugs available, Suction available, Patient being monitored and Timeout performed Oxygen Delivery Method: Simple face mask Placement Confirmation: positive ETCO2

## 2023-03-27 NOTE — Anesthesia Procedure Notes (Signed)
Spinal  Patient location during procedure: OR Start time: 03/27/2023 12:56 PM End time: 03/27/2023 1:01 PM Reason for block: surgical anesthesia Staffing Performed: anesthesiologist  Anesthesiologist: Trevor Iha, MD Performed by: Trevor Iha, MD Authorized by: Trevor Iha, MD   Preanesthetic Checklist Completed: patient identified, IV checked, risks and benefits discussed, surgical consent, monitors and equipment checked, pre-op evaluation and timeout performed Spinal Block Patient position: left lateral decubitus Prep: DuraPrep and site prepped and draped Patient monitoring: heart rate, cardiac monitor, continuous pulse ox and blood pressure Approach: midline Location: L3-4 Injection technique: single-shot Needle Needle type: Pencan  Needle gauge: 24 G Needle length: 10 cm Needle insertion depth: 6 cm Assessment Sensory level: T4 Events: CSF return Additional Notes  1 Attempt (s). Pt tolerated procedure well.

## 2023-03-27 NOTE — Anesthesia Postprocedure Evaluation (Signed)
Anesthesia Post Note  Patient: Cassidy Stevens  Procedure(s) Performed: TOTAL HIP ARTHROPLASTY ANTERIOR APPROACH (Right: Hip)     Patient location during evaluation: Nursing Unit Anesthesia Type: Spinal Level of consciousness: oriented and awake and alert Pain management: pain level controlled Vital Signs Assessment: post-procedure vital signs reviewed and stable Respiratory status: spontaneous breathing and respiratory function stable Cardiovascular status: blood pressure returned to baseline and stable Postop Assessment: no headache, no backache, no apparent nausea or vomiting and patient able to bend at knees Anesthetic complications: no   No notable events documented.  Last Vitals:  Vitals:   03/27/23 1715 03/27/23 1949  BP: 105/69 (!) 140/90  Pulse: 88 86  Resp: 16 18  Temp: 36.9 C 36.9 C  SpO2: 97% 98%    Last Pain:  Vitals:   03/27/23 1949  TempSrc: Oral  PainSc:                  Trevor Iha

## 2023-03-27 NOTE — Interval H&P Note (Signed)
History and Physical Interval Note:  03/27/2023 12:25 PM  Cassidy Stevens  has presented today for surgery, with the diagnosis of RIGHT HIP FRACTURE.  The various methods of treatment have been discussed with the patient and family. After consideration of risks, benefits and other options for treatment, the patient has consented to  Procedure(s): TOTAL HIP ARTHROPLASTY ANTERIOR APPROACH (Right) as a surgical intervention.  The patient's history has been reviewed, patient examined, no change in status, stable for surgery.  I have reviewed the patient's chart and labs.  Questions were answered to the patient's satisfaction.     Nestor Lewandowsky

## 2023-03-27 NOTE — Anesthesia Preprocedure Evaluation (Addendum)
Anesthesia Evaluation  Patient identified by MRN, date of birth, ID band Patient awake    Reviewed: Allergy & Precautions, NPO status , Patient's Chart, lab work & pertinent test results  Airway Mallampati: I  TM Distance: >3 FB Neck ROM: Full    Dental no notable dental hx. (+) Edentulous Upper, Edentulous Lower   Pulmonary COPD, Current Smoker and Patient abstained from smoking.   Pulmonary exam normal breath sounds clear to auscultation       Cardiovascular Normal cardiovascular exam Rhythm:Regular Rate:Normal     Neuro/Psych  Headaches    GI/Hepatic negative GI ROS,,,(+) Cirrhosis       , Hepatitis -, CLab Results      Component                Value               Date                      ALT                      15                  03/27/2023                AST                      15                  03/27/2023                ALKPHOS                  129 (H)             03/27/2023                BILITOT                  0.7                 03/27/2023              Endo/Other    Renal/GU negative Renal ROSLab Results      Component                Value               Date                      CREATININE               0.51                03/27/2023                 K                        4.2                 03/27/2023                    Musculoskeletal  (+) Arthritis ,  Fibromyalgia -  Abdominal   Peds  Hematology Lab Results      Component                Value  Date                      WBC                      9.2                 03/27/2023                HGB                      14.5                03/27/2023                HCT                      41.5                03/27/2023                MCV                      90.2                03/27/2023                PLT                      264                 03/27/2023              Anesthesia Other Findings   Reproductive/Obstetrics                               Anesthesia Physical Anesthesia Plan  ASA: 3 and emergent  Anesthesia Plan: Spinal   Post-op Pain Management: Minimal or no pain anticipated, Ofirmev IV (intra-op)* and Precedex   Induction:   PONV Risk Score and Plan: 2 and Treatment may vary due to age or medical condition, Ondansetron and Aprepitant  Airway Management Planned: Natural Airway and Nasal Cannula  Additional Equipment: None  Intra-op Plan:   Post-operative Plan:   Informed Consent: I have reviewed the patients History and Physical, chart, labs and discussed the procedure including the risks, benefits and alternatives for the proposed anesthesia with the patient or authorized representative who has indicated his/her understanding and acceptance.     Dental advisory given  Plan Discussed with:   Anesthesia Plan Comments:          Anesthesia Quick Evaluation

## 2023-03-27 NOTE — Op Note (Addendum)
PATIENT ID:      Cassidy Stevens  MRN:     [id number] DOB/AGE:    62-22-1962 / 62 y.o.  OPERATIVE REPORT   DATE OF PROCEDURE:  03/27/2023      PREOPERATIVE DIAGNOSIS:  RIGHT HIP FRACTURE                                                         POSTOPERATIVE DIAGNOSIS:  Same                                                         PROCEDURE: Anterior R total hip arthroplasty using a 50 mm DePuy Gryption sector Cup, [employer], 0-degree polyethylene liner, a +5 mm x 32mm ceramic head, a 5hi Depuy Actis stem  SURGEON: Nestor Lewandowsky  ASSISTANT:  Elodia Florence PA-C  (present throughout entire procedure and necessary for timely completion of the procedure)   ANESTHESIA: Spinal, Exparel 133mg  injection BLOOD LOSS: 400 cc FLUID REPLACEMENT: 1500 cc crystalloid TRANEXAMIC ACID: 1gm IV, 2gm Topical COMPLICATIONS: none    INDICATIONS FOR PROCEDURE: A 62 y.o. year-old With  RIGHT HIP FRACTURE that occurred spontaneously 5 weeks ago.  She presented to the clinic and saw Dr. Luiz Blare plain radiographs were nondiagnostic but MRI scan clearly showed a nondisplaced right femoral neck fracture.  After discussion the patient elected to treat this conservatively with a walker and guarded weightbearing.  Unfortunately yesterday she fell off of her walker and displaced the right femoral neck fracture.  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  Because of this she elects to have a right total of arthroplasty as treatment for her displaced right femoral neck fracture. The risks, benefits, and alternatives were discussed at length including but not limited to the risks of infection, bleeding, nerve injury, stiffness, blood clots, the need for revision surgery, cardiopulmonary complications, among others, and they were willing to proceed. Questions answered      PROCEDURE IN DETAIL: The patient was identified by armband, received preoperative IV  antibiotics, in the holding area at [facility], taken to the operating room , appropriate anesthetic monitors were attached and anesthesia was induced with the patient on the gurney. HANA boots were applied to the feet, and the patient  was transferred to the HANA table with a peroneal post and support underneath the non-operative leg. The operative lower extremity was then prepped and draped in the usual sterile fashion from just above the iliac crest to the knee. And a timeout procedure was performed. Elodia Florence [facility] was present and scrubbed throughout the case, critical for assistance with, positioning, exposure, retraction, instrumentation, and closure.Skin along incision area was injected with 10 cc of Exparel solution. We then made a 12 cm incision along the interval at the leading edge of the tensor fascia lata of starting at 2 cm lateral to the ASIS. Small bleeders in the skin and subcutaneous tissue identified and cauterized we dissected down to the fascia and made an incision in the fascia allowing [place] to elevate the fascia of the  tensor muscle and exploited the interval between the rectus and the tensor fascia lata. A Cobra retractor was then placed along the superior neck of the femur. A cerebellar retractor was used to expose the interval between the tensor fascia lata and the rectus femoris.  We identified and cauterized the ascending branch of the anterior circumflex artery. A second Cobra retractor along the inferior neck of the femur. A small Hohmann retractor was placed underneath the origin of the rectus femoris, giving [place] good medial exposure. Using Ronguers fatty tissue was removed from in front of the anterior capsule. The capsule was then incised, starting out at the superior anterior rim of the acetabulum going laterally along the anterior neck. The capsule was then teed along the neck superiorly and inferiorly. Electrocautery was used to release capsule from the anterior and medial  neck of the femur to allow external rotation. The cobra retractors were then placed along the inferior and superior neck. The hip was externally rotated to 40 degrees, traction applied and locked. We perform a standard neck cut with the oscillating saw and removed the femoral head with a power corkscrew. We then placed a medium curved medium homan retractor in the cotyloid notch and standard cobra retractor posteriorly along the acetabular rim. Exposed labral tissue and osteophytes were then removed. We then sequentially reamed up to a 49 mm basket reamer obtaining good coverage in all quadrants, verified by C-arm imaging. Under C-arm control we then hammered into place a 50 mm DePuy GRIPTION sector cup in 45 of abduction and 15 of anteversion. The cup seated nicely and required no supplemental screws. We then placed a central hole Eliminator and a +4 mm, 0 polyethylene liner. The foot was then externally rotated to 130-140. The limb was extended and adducted to the floor, delivering the proximal femur up into the wound. A medium curved Hohmann retractor was placed over the greater trochanter and a long Homan retractor along the posterior femoral neck completing the exposure and lateralizing the femur. We then performed releases superiorly and and inferiorly of the capsule going back to the pirformis fossa superiorly and to the lesser trochanter inferiorly. We then entered the proximal femur with the box cutting offset chisel followed by, a canal sounder, the chili pepper and broaching up to a 5 broach. This seated nicely and we reamed the calcar.  After reaming the calcar there was a small crack evident anteriorly.  Because of this we went ahead and placed a 2 mm Biomet cable through the inotrope region to reinforce the proximal femur.  A trial reduction was performed with a 5 mm X 32 mm head.The limb lengths were checked by c-arm, and the hip was stable in 90 of external rotation. At this point the trial  components removed and we hammered into place a std  Offset # 5Actis stem with coating. A + 5 mm x 32 ceramic head was then hammered into place. The hip was reduced and final C-arm images obtained. The wound was thoroughly irrigated with normal saline solution. We repaired the ant capsule and the tensor fascia lot a with running 0 vicryl suture. the subcutaneous and subcuticular layers were closed with running 3-0 Vicryl suture followed by an Aquacil dressing. At this point the patient was awaken and transferred to hospital gurney without difficulty.   Nestor Lewandowsky 03/27/2023, 12:26 PM

## 2023-03-27 NOTE — Transfer of Care (Signed)
Immediate Anesthesia Transfer of Care Note  Patient: Cassidy Stevens  Procedure(s) Performed: TOTAL HIP ARTHROPLASTY ANTERIOR APPROACH (Right: Hip)  Patient Location: PACU  Anesthesia Type:Spinal  Level of Consciousness: awake, drowsy, and responds to stimulation  Airway & Oxygen Therapy: Patient Spontanous Breathing and Patient connected to face mask oxygen  Post-op Assessment: Report given to RN and Post -op Vital signs reviewed and stable  Post vital signs: Reviewed and stable  Last Vitals:  Vitals Value Taken Time  BP 96/62 03/27/23 1455  Temp    Pulse 78 03/27/23 1458  Resp 16 03/27/23 1458  SpO2 100 % 03/27/23 1458  Vitals shown include unvalidated device data.  Last Pain:  Vitals:   03/27/23 1138  TempSrc:   PainSc: 10-Worst pain ever      Patients Stated Pain Goal: 0 (03/27/23 0403)  Complications: No notable events documented.

## 2023-03-27 NOTE — Progress Notes (Addendum)
PROGRESS NOTE   Cassidy Stevens  ZOX:[id number]    DOB: 16-Mar-1961    DOA: 03/26/2023  PCP: Remus Loffler, PA-C   I have briefly reviewed patients previous medical records in [facility].    Brief Narrative:  62 year old female, lives with extended family, ambulates with the help of walker but was also looking for a wheelchair, extensive PMH including COPD, ongoing tobacco use, cirrhosis, chronic hyponatremia, presented to the ED with complaints of right hip pain following ground-level fall at home.  Admitted for right femoral neck fracture.  Orthopedics consulted and plan right anterior total hip arthroplasty?  Later today versus tomorrow.   Assessment & Plan:  Principal Problem:   Closed right hip fracture (HCC) Active Problems:   Hypokalemia   Fall at home, initial encounter   Leukocytosis   SIRS (systemic inflammatory response syndrome) (HCC)   Cirrhosis (HCC)   History of COPD   Tobacco abuse   Chronic hyponatremia   Right femoral neck fracture: Sustained after ground-level mechanical fall at home. As per orthopedic consultation, patient originally fractured her right femoral neck about a month ago at which time it was minimally displaced valgus fracture, THA was planned but postponed due to lumbar spine issues, now has further displacement of the right femoral neck fracture that needs surgical intervention. Treat vitamin D deficiency as below.  Will need bone density scan as outpatient. Per orthopedics plan right anterior total hip arthroplasty on 6/29 (however nurse at bedside mentioned that the surgery was for 1 PM on 6/28). Multimodality pain control. Patient has limited activity PTA, only ambulates at home, no at rest or exertional dyspnea, chest pain, dizziness, lightheadedness or syncope.  No cardiac history reported.  May proceed with indicated surgery with close perioperative management.  Patient is at mild to moderate risk from age and  comorbidities.  COPD: Does not use home oxygen. No clinical bronchospasm. Incentive spirometry and flutter valve Continue home inhalers. Chest x-ray negative  Hypokalemia: Replaced.  Magnesium 2.  Tobacco use disorder: Cessation counseled.  Continue nicotine patch.  Chronic hyponatremia: Clinically euvolemic.  Stable.  Periodic follow-up.  Vitamin D deficiency: Vitamin D 25-hydroxy: 9.67 Started vitamin D 50 K units weekly.  Body mass index is 30.39 kg/m./Obesity Outpatient follow-up.   ACP Documents: None present DVT prophylaxis: SCDs Start: 03/26/23 2007     Code Status: Full Code:  Family Communication: Daughter via phone. Disposition:  Status is: Inpatient Remains inpatient appropriate because: Awaiting surgery.  Will likely need SNF at discharge, may be early next week.     Consultants:   Orthopedics  Procedures:     Antimicrobials:      Subjective:  History mostly as noted above.  Right hip pain, intermittent and at times severe, worse with movements.  Unable to weight-bear due to pain.  No chest pain, dyspnea.  Objective:   Vitals:   03/26/23 2140 03/27/23 0155 03/27/23 0412 03/27/23 0737  BP: 127/74 122/77 (!) 149/92   Pulse: 77 75 85   Resp: 18 18 18    Temp: 99.1 F (37.3 C) 98.2 F (36.8 C) 99.1 F (37.3 C)   TempSrc: Oral Oral Oral   SpO2: 99% 96% 99% 99%  Weight: 80.3 kg  80.3 kg   Height: 5\' 4"  (1.626 m)       General exam: Middle-age female, looks older than stated age, lying comfortably propped up in bed without distress.  Oral mucosa moist.  Edentulous. Respiratory system: Clear to auscultation without wheezing, rhonchi  or crackles.  No increased work of breathing. Cardiovascular system: S1 & S2 heard, RRR. No JVD, murmurs, rubs, gallops or clicks. No pedal edema.  Telemetry personally reviewed: Sinus rhythm. Gastrointestinal system: Abdomen is nondistended, soft and nontender. No organomegaly or masses felt. Normal bowel sounds  heard. Central nervous system: Alert and oriented. No focal neurological deficits. Extremities: Symmetric 5 x 5 power. Skin: No rashes, lesions or ulcers Psychiatry: Judgement and insight appear normal. Mood & affect appropriate.     Data Reviewed:   I have personally reviewed following labs and imaging studies   CBC: Recent Labs  Lab 03/26/23 1746 03/27/23 0341  WBC 12.1* 9.2  NEUTROABS 8.9* 5.3  HGB 13.7 14.5  HCT 39.1 41.5  MCV 90.5 90.2  PLT 245 264    Basic Metabolic Panel: Recent Labs  Lab 03/26/23 1746 03/27/23 0341  NA 131* 132*  K 3.2* 4.2  CL 100 101  CO2 21* 22  GLUCOSE 94 111*  BUN 8 5*  CREATININE 0.38* 0.51  CALCIUM 8.0* 9.1  MG  --  2.0  PHOS  --  4.9*    Liver Function Tests: Recent Labs  Lab 03/27/23 0341  AST 15  ALT 15  ALKPHOS 129*  BILITOT 0.7  PROT 7.6  ALBUMIN 4.0    CBG: No results for input(s): "GLUCAP" in the last 168 hours.  Microbiology Studies:   Recent Results (from the past 240 hour(s))  Surgical PCR screen     Status: None   Collection Time: 03/27/23  5:55 AM   Specimen: Nasal Mucosa; Nasal Swab  Result Value Ref Range Status   MRSA, PCR NEGATIVE NEGATIVE Final   Staphylococcus aureus NEGATIVE NEGATIVE Final    Comment: (NOTE) The Xpert SA Assay (FDA approved for NASAL specimens in patients 33 years of age and older), is one component of a comprehensive surveillance program. It is not intended to diagnose infection nor to guide or monitor treatment. Performed at [facility], [address] [place], [place] [address]     Radiology Studies:  DG Chest Portable 1 View  Result Date: 03/26/2023 CLINICAL DATA:  Preop EXAM: PORTABLE CHEST 1 VIEW COMPARISON:  03/05/2023 FINDINGS: Unchanged cardiac and mediastinal contours given AP technique. No new focal pulmonary opacity. No pleural effusion or pneumothorax. No acute osseous abnormality. IMPRESSION: No active disease. Electronically Signed    By: Wiliam Ke M.D.   On: 03/26/2023 19:43   DG Hip Unilat W or Wo Pelvis 2-3 Views Right  Result Date: 03/26/2023 CLINICAL DATA:  Status post fall. EXAM: DG HIP (WITH OR WITHOUT PELVIS) 2-3V RIGHT COMPARISON:  None Available. FINDINGS: There is an acute fracture deformity extending through the head and neck of the proximal right femur, with approximately 1/2 shaft width dorsal and lateral displacement of the distal fracture site. There is no evidence of dislocation. IMPRESSION: Acute fracture of the proximal right femur. Electronically Signed   By: Aram Candela M.D.   On: 03/26/2023 19:41    Scheduled Meds:    fluticasone furoate-vilanterol  1 puff Inhalation Daily   And   umeclidinium bromide  1 puff Inhalation Daily   gabapentin  300 mg Oral TID   mupirocin ointment  1 Application Nasal BID    Continuous Infusions:     ceFAZolin (ANCEF) IV     tranexamic acid       LOS: 1 day     Marcellus Scott, MD,  FACP, FHM, SFHM, CMPC, CHCQM-PHYADV   [facility] Physician Advisor [place]     To contact the attending provider between 7A-7P or the covering provider during after hours 7P-7A, please log into the web site [email or web address] and access using universal [place] password for that web site. If you do not have the password, please call the hospital operator.  03/27/2023, 9:14 AM

## 2023-03-27 NOTE — TOC Initial Note (Signed)
Transition of Care [facility]) - Initial/Assessment Note   Patient Details  Name: Cassidy Stevens MRN: [id number] Date of Birth: [birth year]  Transition of Care [facility]) CM/SW Contact:    Ewing Schlein, LCSW Phone Number: 03/27/2023, 10:23 AM  Clinical Narrative: Patient is expected to have surgery today. TOC awaiting PT evaluation following surgery.                 Expected Discharge Plan:  (To be determined) Barriers to Discharge: Continued Medical Work up  Expected Discharge Plan and Services Living arrangements for the past 2 months: Mobile Home  Prior Living Arrangements/Services Living arrangements for the past 2 months: Mobile Home Patient language and need for interpreter reviewed:: Yes Need for Family Participation in Patient Care: No (Comment) Care giver support system in place?: Yes (comment) Criminal Activity/Legal Involvement Pertinent to Current Situation/Hospitalization: No - Comment as needed  Activities of Daily Living Home Assistive Devices/Equipment: None ADL Screening (condition at time of admission) Patient's cognitive ability adequate to safely complete daily activities?: Yes Is the patient deaf or have difficulty hearing?: No Does the patient have difficulty seeing, even when wearing glasses/contacts?: No Does the patient have difficulty concentrating, remembering, or making decisions?: Yes Patient able to express need for assistance with ADLs?: Yes Does the patient have difficulty dressing or bathing?: Yes Independently performs ADLs?: No Communication: Independent Dressing (OT): Needs assistance Is this a change from baseline?: Change from baseline, expected to last <3days Grooming: Independent Feeding: Independent Bathing: Needs assistance Is this a change from baseline?: Change from baseline, expected to last <3 days Toileting: Needs assistance Is this a change from baseline?: Change from baseline, expected to last <3 days In/Out Bed: Needs  assistance Is this a change from baseline?: Change from baseline, expected to last <3 days Walks in Home: Needs assistance Is this a change from baseline?: Change from baseline, expected to last <3 days Does the patient have difficulty walking or climbing stairs?: Yes Weakness of Legs: Right Weakness of Arms/Hands: None  Emotional Assessment Orientation: : Oriented to Self, Oriented to Place, Oriented to  Time, Oriented to Situation Alcohol / Substance Use: Tobacco Use Psych Involvement: No (comment)  Admission diagnosis:  Closed right hip fracture (HCC) [S72.001A] Fall, initial encounter [W19.XXXA] Closed fracture of right hip, initial encounter [facility]) [S72.001A] Patient Active Problem List   Diagnosis Date Noted   Closed right hip fracture (HCC) 03/26/2023   Hypokalemia 03/26/2023   Fall at home, initial encounter 03/26/2023   Leukocytosis 03/26/2023   SIRS (systemic inflammatory response syndrome) (HCC) 03/26/2023   Cirrhosis (HCC) 03/26/2023   History of COPD 03/26/2023   Tobacco abuse 03/26/2023   Chronic hyponatremia 03/26/2023   Pain in both lower extremities 11/27/2021   Chronic midline low back pain without sciatica 11/27/2021   Chronic neck pain 11/27/2021   PCP:  Remus Loffler, PA-C Pharmacy:   [facility] - [address] ([place]), [place] - [address]  ([place]) [place] [address] Phone: [phone] Fax: [phone]  [facility] [address] [place] [address] Phone: [phone] Fax: [phone]  Social Determinants of Health (SDOH) Social History: SDOH Screenings   Food Insecurity: No Food Insecurity (03/26/2023)  Housing: Low Risk  (03/26/2023)  Transportation Needs: No Transportation Needs (03/26/2023)  Utilities: Not At Risk (03/26/2023)  Depression (PHQ2-9): Low Risk  (03/11/2023)  Tobacco Use: High Risk (03/26/2023)   SDOH Interventions:    Readmission Risk  Interventions  No data to display         

## 2023-03-27 NOTE — Interval H&P Note (Signed)
History and Physical Interval Note:  03/27/2023 6:15 AM  Cassidy Stevens  has presented today for surgery, with the diagnosis of RIGHT HIP FRACTURE.  The various methods of treatment have been discussed with the patient and family. After consideration of risks, benefits and other options for treatment, the patient has consented to  Procedure(s): TOTAL HIP ARTHROPLASTY ANTERIOR APPROACH (Right) as a surgical intervention.  The patient's history has been reviewed, patient examined, no change in status, stable for surgery.  I have reviewed the patient's chart and labs.  Questions were answered to the patient's satisfaction.     Nestor Lewandowsky

## 2023-03-27 NOTE — Plan of Care (Signed)
?  Problem: Education: ?Goal: Knowledge of General Education information will improve ?Description: Including pain rating scale, medication(s)/side effects and non-pharmacologic comfort measures ?Outcome: Progressing ?  ?Problem: Clinical Measurements: ?Goal: Respiratory complications will improve ?Outcome: Progressing ?  ?Problem: Elimination: ?Goal: Will not experience complications related to urinary retention ?Outcome: Progressing ?  ?Problem: Pain Managment: ?Goal: General experience of comfort will improve ?Outcome: Progressing ?  ?Problem: Safety: ?Goal: Ability to remain free from injury will improve ?Outcome: Progressing ?  ?Problem: Skin Integrity: ?Goal: Risk for impaired skin integrity will decrease ?Outcome: Progressing ?  ?

## 2023-03-28 DIAGNOSIS — D62 Acute posthemorrhagic anemia: Secondary | ICD-10-CM

## 2023-03-28 DIAGNOSIS — S72001D Fracture of unspecified part of neck of right femur, subsequent encounter for closed fracture with routine healing: Secondary | ICD-10-CM | POA: Diagnosis not present

## 2023-03-28 LAB — CBC
HCT: 33.1 % — ABNORMAL LOW (ref 36.0–46.0)
Hemoglobin: 11.8 g/dL — ABNORMAL LOW (ref 12.0–15.0)
MCH: 32.1 pg (ref 26.0–34.0)
MCHC: 35.6 g/dL (ref 30.0–36.0)
MCV: 89.9 fL (ref 80.0–100.0)
Platelets: 185 10*3/uL (ref 150–400)
RBC: 3.68 MIL/uL — ABNORMAL LOW (ref 3.87–5.11)
RDW: 12.2 % (ref 11.5–15.5)
WBC: 9.7 10*3/uL (ref 4.0–10.5)
nRBC: 0 % (ref 0.0–0.2)

## 2023-03-28 LAB — BASIC METABOLIC PANEL
Anion gap: 7 (ref 5–15)
BUN: 5 mg/dL — ABNORMAL LOW (ref 8–23)
CO2: 25 mmol/L (ref 22–32)
Calcium: 8.2 mg/dL — ABNORMAL LOW (ref 8.9–10.3)
Chloride: 96 mmol/L — ABNORMAL LOW (ref 98–111)
Creatinine, Ser: 0.44 mg/dL (ref 0.44–1.00)
GFR, Estimated: >60 mL/min (ref >60)
Glucose, Bld: 122 mg/dL — ABNORMAL HIGH (ref 70–99)
Potassium: 3.8 mmol/L (ref 3.5–5.1)
Sodium: 128 mmol/L — ABNORMAL LOW (ref 135–145)

## 2023-03-28 MED ORDER — OXYCODONE HCL 5 MG PO TABS: 5.0000 mg | ORAL_TABLET | Freq: Four times a day (QID) | ORAL | 0 refills | Status: DC | PRN

## 2023-03-28 MED ORDER — ASPIRIN 81 MG PO CHEW: 81.0000 mg | CHEWABLE_TABLET | Freq: Two times a day (BID) | ORAL | 0 refills | Status: DC

## 2023-03-28 MED ORDER — TIZANIDINE HCL 2 MG PO TABS: 2.0000 mg | ORAL_TABLET | Freq: Four times a day (QID) | ORAL | 0 refills | Status: DC | PRN

## 2023-03-28 NOTE — Progress Notes (Signed)
Subjective: 1 Day Post-Op Procedure(s) (LRB): TOTAL HIP ARTHROPLASTY ANTERIOR APPROACH (Right)  Patient resting comfortably in bed this morning. After speaking with family yesterday the plan is for SNF placement.  Activity level:  wbat Diet tolerance:  ok Voiding:  ok Patient reports pain as mild.    Objective: Vital signs in last 24 hours: Temp:  [98.1 F (36.7 C)-98.8 F (37.1 C)] 98.4 F (36.9 C) (06/29 0550) Pulse Rate:  [72-97] 90 (06/29 0550) Resp:  [12-20] 19 (06/29 0550) BP: (87-143)/(62-90) 143/85 (06/29 0550) SpO2:  [97 %-100 %] 98 % (06/29 0550)  Labs: Recent Labs    03/26/23 1746 03/27/23 0341 03/28/23 0415  HGB 13.7 14.5 11.8*   Recent Labs    03/27/23 0341 03/28/23 0415  WBC 9.2 9.7  RBC 4.60 3.68*  HCT 41.5 33.1*  PLT 264 185   Recent Labs    03/27/23 0341 03/28/23 0415  NA 132* 128*  K 4.2 3.8  CL 101 96*  CO2 22 25  BUN 5* 5*  CREATININE 0.51 0.44  GLUCOSE 111* 122*  CALCIUM 9.1 8.2*   Recent Labs    03/27/23 0341  INR 1.0    Physical Exam:  Neurologically intact ABD soft Neurovascular intact Sensation intact distally Intact pulses distally Dorsiflexion/Plantar flexion intact Incision: dressing C/D/I and no drainage No cellulitis present Compartment soft  Assessment/Plan:  1 Day Post-Op Procedure(s) (LRB): TOTAL HIP ARTHROPLASTY ANTERIOR APPROACH (Right) Advance diet Up with therapy D/C IV fluids Discharge to SNF once cleared by PT and medical team. Foley out this morning. We greatly appreciate medical management.  Follow up with Dr. Turner Daniels in office 2 weeks post op. 81mg  asa BID for DVT prevention for 2 weeks then once a day for 2 weeks.    Ginger Organ Gleb Mcguire 03/28/2023, 7:54 AM

## 2023-03-28 NOTE — Progress Notes (Signed)
PROGRESS NOTE   Cassidy Stevens  ZOX:[id number]    DOB: 1961-09-13    DOA: 03/26/2023  PCP: Remus Loffler, PA-C   I have briefly reviewed patients previous medical records in [facility].    Brief Narrative:  62 year old female, lives with extended family, ambulates with the help of walker but was also looking for a wheelchair, extensive PMH including COPD, ongoing tobacco use, cirrhosis, chronic hyponatremia, presented to the ED with complaints of right hip pain following ground-level fall at home.  Admitted for right femoral neck fracture.  Orthopedics consulted and s/p anterior right total hip arthroplasty 6/28 by Dr. Turner Daniels   Assessment & Plan:  Principal Problem:   Closed right hip fracture [facility]) Active Problems:   Hypokalemia   Fall at home, initial encounter   Leukocytosis   SIRS (systemic inflammatory response syndrome) (HCC)   Cirrhosis (HCC)   History of COPD   Tobacco abuse   Chronic hyponatremia   Right femoral neck fracture: Sustained after ground-level mechanical fall at home. As per orthopedic consultation, patient originally fractured her right femoral neck about a month ago at which time it was minimally displaced valgus fracture, THA was planned but postponed due to lumbar spine issues, now has further displacement of the right femoral neck fracture that needs surgical intervention. Treat vitamin D deficiency as below.  Will need bone density scan as outpatient. S/p right anterior total hip arthroplasty on 6/29 Multimodality pain control. Awaiting PT evaluation.  Orthopedic follow-up appreciated, recommend follow-up in office with Dr. Turner Daniels in 2 weeks postop, aspirin 81 Mg twice daily x 2 weeks followed by once daily for postop DVT prophylaxis.  Acute posthemorrhagic anemia: Surgical EBL: 400 mL Hemoglobin dropped from 14.5 preop to 11.8 postop. Follow CBC daily and transfuse if hemoglobin 7 g or less.  COPD: Does not use home oxygen. No clinical  bronchospasm. Incentive spirometry and flutter valve Continue home inhalers. Chest x-ray negative  Hypokalemia: Replaced.  Magnesium 2.  Tobacco use disorder: Cessation counseled.  Continue nicotine patch.  Chronic hyponatremia: Clinically euvolemic.  Stable.  Periodic follow-up.  Vitamin D deficiency: Vitamin D 25-hydroxy: 9.67 Started vitamin D 50 K units weekly.  Body mass index is 30.39 kg/m./Obesity Outpatient follow-up.   ACP Documents: None present DVT prophylaxis: SCDs Start: 03/27/23 1715 Place TED hose Start: 03/27/23 1519 SCDs Start: 03/26/23 2007     Code Status: Full Code:  Family Communication: None at bedside Disposition:  Inpatient, immediate postop, pending PT evaluation, possible DC 7/1, may be to SNF     Consultants:   Orthopedics  Procedures:   As above  Antimicrobials:      Subjective:  Appropriate right hip postop pain, controlled with pain meds.  PT in room getting to work with her.  Objective:   Vitals:   03/27/23 2222 03/28/23 0108 03/28/23 0550 03/28/23 0754  BP: 131/80 125/85 (!) 143/85   Pulse: 97 85 90   Resp: 20 19 19    Temp: 98.8 F (37.1 C) 98.3 F (36.8 C) 98.4 F (36.9 C)   TempSrc:  Oral    SpO2: 97% 97% 98% 98%  Weight:      Height:        General exam: Middle-age female, looks older than stated age, lying comfortably propped up in bed without distress.  Oral mucosa moist.  Edentulous. Respiratory system: Clear to auscultation without wheezing, rhonchi or crackles.  No increased work of breathing. Cardiovascular system: S1 & S2 heard, RRR. No JVD,  murmurs, rubs, gallops or clicks. No pedal edema.  Telemetry personally reviewed: Sinus rhythm. Gastrointestinal system: Abdomen is nondistended, soft and nontender. No organomegaly or masses felt. Normal bowel sounds heard. Central nervous system: Alert and oriented. No focal neurological deficits. Extremities: Symmetric 5 x 5 power.  Right hip surgical site dressing  clean and dry. Skin: No rashes, lesions or ulcers Psychiatry: Judgement and insight appear normal. Mood & affect appropriate.     Data Reviewed:   I have personally reviewed following labs and imaging studies   CBC: Recent Labs  Lab 03/26/23 1746 03/27/23 0341 03/28/23 0415  WBC 12.1* 9.2 9.7  NEUTROABS 8.9* 5.3  --   HGB 13.7 14.5 11.8*  HCT 39.1 41.5 33.1*  MCV 90.5 90.2 89.9  PLT 245 264 185    Basic Metabolic Panel: Recent Labs  Lab 03/26/23 1746 03/27/23 0341 03/28/23 0415  NA 131* 132* 128*  K 3.2* 4.2 3.8  CL 100 101 96*  CO2 21* 22 25  GLUCOSE 94 111* 122*  BUN 8 5* 5*  CREATININE 0.38* 0.51 0.44  CALCIUM 8.0* 9.1 8.2*  MG  --  2.0  --   PHOS  --  4.9*  --     Liver Function Tests: Recent Labs  Lab 03/27/23 0341  AST 15  ALT 15  ALKPHOS 129*  BILITOT 0.7  PROT 7.6  ALBUMIN 4.0    CBG: No results for input(s): "GLUCAP" in the last 168 hours.  Microbiology Studies:   Recent Results (from the past 240 hour(s))  Surgical PCR screen     Status: None   Collection Time: 03/27/23  5:55 AM   Specimen: Nasal Mucosa; Nasal Swab  Result Value Ref Range Status   MRSA, PCR NEGATIVE NEGATIVE Final   Staphylococcus aureus NEGATIVE NEGATIVE Final    Comment: (NOTE) The Xpert SA Assay (FDA approved for NASAL specimens in patients 62 years of age and older), is one component of a comprehensive surveillance program. It is not intended to diagnose infection nor to guide or monitor treatment. Performed at [facility], [address], [place], [place] [address]     Radiology Studies:  DG HIP UNILAT WITH PELVIS 1V RIGHT  Result Date: 03/27/2023 CLINICAL DATA:  Right hip replacement EXAM: DG HIP (WITH OR WITHOUT PELVIS) 1V RIGHT COMPARISON:  03/26/2023 FINDINGS: Eleven fluoroscopic images are obtained during the performance of the procedure and are provided for interpretation only. Images demonstrate right total hip replacement with  cerclage wire. Fluoroscopy time: 5 seconds Cumulative air kerma: 0.769 mGy IMPRESSION: Intraoperative fluoroscopic guidance for right total hip replacement. Electronically Signed   By: Wiliam Ke M.D.   On: 03/27/2023 15:39   DG C-Arm 1-60 Min-No Report  Result Date: 03/27/2023 Fluoroscopy was utilized by the requesting physician.  No radiographic interpretation.   DG C-Arm 1-60 Min-No Report  Result Date: 03/27/2023 Fluoroscopy was utilized by the requesting physician.  No radiographic interpretation.   DG Chest Portable 1 View  Result Date: 03/26/2023 CLINICAL DATA:  Preop EXAM: PORTABLE CHEST 1 VIEW COMPARISON:  03/05/2023 FINDINGS: Unchanged cardiac and mediastinal contours given AP technique. No new focal pulmonary opacity. No pleural effusion or pneumothorax. No acute osseous abnormality. IMPRESSION: No active disease. Electronically Signed   By: Wiliam Ke M.D.   On: 03/26/2023 19:43   DG Hip Unilat W or Wo Pelvis 2-3 Views Right  Result Date: 03/26/2023 CLINICAL DATA:  Status post fall. EXAM: DG HIP (WITH OR WITHOUT PELVIS) 2-3V  RIGHT COMPARISON:  None Available. FINDINGS: There is an acute fracture deformity extending through the head and neck of the proximal right femur, with approximately 1/2 shaft width dorsal and lateral displacement of the distal fracture site. There is no evidence of dislocation. IMPRESSION: Acute fracture of the proximal right femur. Electronically Signed   By: Aram Candela M.D.   On: 03/26/2023 19:41    Scheduled Meds:    aspirin  81 mg Oral BID   docusate sodium  100 mg Oral BID   fluticasone furoate-vilanterol  1 puff Inhalation Daily   And   umeclidinium bromide  1 puff Inhalation Daily   gabapentin  300 mg Oral TID   Vitamin D (Ergocalciferol)  50,000 Units Oral Q7 days    Continuous Infusions:    lactated ringers 75 mL/hr at 03/28/23 0555   methocarbamol (ROBAXIN) IV       LOS: 2 days     Marcellus Scott, MD,  FACP, [facility], [facility],  [facility], [facility]   [facility] [place]     To contact the attending provider between 7A-7P or the covering provider during after hours 7P-7A, please log into the web site [email or web address] and access using universal [place] password for that web site. If you do not have the password, please call the hospital operator.  03/28/2023, 10:15 AM

## 2023-03-28 NOTE — TOC Initial Note (Signed)
Transition of Care [facility]) - Initial/Assessment Note    Patient Details  Name: Cassidy Stevens MRN: [id number] Date of Birth: 1961-04-03  Transition [facility]) CM/SW Contact:    Georgie Chard, LCSW Phone Number: 03/28/2023, 2:55 PM  Clinical Narrative:                 CSW has spoke to the patient. AT this time the patient is wanting SNF stated that it would be best at this time for her recovery. Patient also stated that she has DME set up through an outside company through her PCP. The patient stated that she would follow up Monday to make sure. This CSW has started that SNF process received PASRR for the first time. This CSW has sent the patient out through the HUB as well explained to the patient process and medicare. Gov to review facilites. TOC will follow up with patient once bed offers are available expected DC is 7/1 if patient's pain is under control. TOC will follow patient for duration of stay.   Expected Discharge Plan: Skilled Nursing Facility Barriers to Discharge: No Barriers Identified   Patient Goals and CMS Choice   CMS Medicare.gov Compare Post Acute Care list provided to:: Patient Choice offered to / list presented to : Patient      Expected Discharge Plan and Services       Living arrangements for the past 2 months: Single Family Home                                      Prior Living Arrangements/Services Living arrangements for the past 2 months: Single Family Home Lives with:: Adult Children Patient language and need for interpreter reviewed:: No Do you feel safe going back to the place where you live?: Yes      Need for Family Participation in Patient Care: No (Comment) Care giver support system in place?: Yes (comment)   Criminal Activity/Legal Involvement Pertinent to Current Situation/Hospitalization: No - Comment as needed  Activities of Daily Living Home Assistive Devices/Equipment: None ADL Screening (condition at time of  admission) Patient's cognitive ability adequate to safely complete daily activities?: Yes Is the patient deaf or have difficulty hearing?: No Does the patient have difficulty seeing, even when wearing glasses/contacts?: No Does the patient have difficulty concentrating, remembering, or making decisions?: Yes Patient able to express need for assistance with ADLs?: Yes Does the patient have difficulty dressing or bathing?: Yes Independently performs ADLs?: No Communication: Independent Dressing (OT): Needs assistance Is this a change from baseline?: Change from baseline, expected to last <3days Grooming: Independent Feeding: Independent Bathing: Needs assistance Is this a change from baseline?: Change from baseline, expected to last <3 days Toileting: Needs assistance Is this a change from baseline?: Change from baseline, expected to last <3 days In/Out Bed: Needs assistance Is this a change from baseline?: Change from baseline, expected to last <3 days Walks in Home: Needs assistance Is this a change from baseline?: Change from baseline, expected to last <3 days Does the patient have difficulty walking or climbing stairs?: Yes Weakness of Legs: Right Weakness of Arms/Hands: None  Permission Sought/Granted                  Emotional Assessment Appearance:: Appears stated age     Orientation: : Oriented to Self, Oriented to Place, Oriented to  Time, Oriented to Situation Alcohol / Substance  Use: Tobacco Use Psych Involvement: No (comment)  Admission diagnosis:  Closed right hip fracture (HCC) [S72.001A] Fall, initial encounter [W19.XXXA] Closed fracture of right hip, initial encounter [facility]) [S72.001A] Patient Active Problem List   Diagnosis Date Noted   Closed right hip fracture (HCC) 03/26/2023   Hypokalemia 03/26/2023   Fall at home, initial encounter 03/26/2023   Leukocytosis 03/26/2023   SIRS (systemic inflammatory response syndrome) (HCC) 03/26/2023   Cirrhosis  (HCC) 03/26/2023   History of COPD 03/26/2023   Tobacco abuse 03/26/2023   Chronic hyponatremia 03/26/2023   Pain in both lower extremities 11/27/2021   Chronic midline low back pain without sciatica 11/27/2021   Chronic neck pain 11/27/2021   PCP:  Remus Loffler, PA-C Pharmacy:   [facility] [place]), [place] - [address] [place]) [place] [address] Phone: [phone] Fax: [phone]  [place] [facility] [address] [place] [address] Phone: [phone] Fax: [phone]     Social Determinants of Health (SDOH) Social History: SDOH Screenings   Food Insecurity: No Food Insecurity (03/26/2023)  Housing: Low Risk  (03/26/2023)  Transportation Needs: No Transportation Needs (03/26/2023)  Utilities: Not At Risk (03/26/2023)  Depression (PHQ2-9): Low Risk  (03/11/2023)  Tobacco Use: High Risk (03/27/2023)   SDOH Interventions:     Readmission Risk Interventions    03/28/2023    2:52 PM  Readmission Risk Prevention Plan  Transportation Screening Complete  PCP or Specialist Appt within 5-7 Days Complete  Home Care Screening Complete  Medication Review (RN CM) Complete

## 2023-03-28 NOTE — Plan of Care (Signed)
  Problem: Coping: Goal: Level of anxiety will decrease Outcome: Progressing   Problem: Pain Managment: Goal: General experience of comfort will improve Outcome: Progressing   Problem: Safety: Goal: Ability to remain free from injury will improve Outcome: Progressing   

## 2023-03-28 NOTE — Evaluation (Signed)
Physical Therapy Evaluation Patient Details Name: Cassidy Stevens MRN: [id number] DOB: 02/18/1961 Today's Date: 03/28/2023  History of Present Illness  Pt s/p fall with R hip fx and now s/p R THR by anterior direct and with hx of Cirrhosis and COPD  Clinical Impression  Pt admitted as above and presenting with functional mobility limitations 2* decreased R LE strength/ROM and post op pain.  This date, pt limited by pain to transfers bed to [facility] to recliner.  Pt will benefit from continued acute stay PT services and follow up PT with 24/7 assist to maximize IND and safety.     Recommendations for follow up therapy are one component of a multi-disciplinary discharge planning process, led by the attending physician.  Recommendations may be updated based on patient status, additional functional criteria and insurance authorization.  Follow Up Recommendations Can patient physically be transported by private vehicle: Yes     Assistance Recommended at Discharge Intermittent Supervision/Assistance  Patient can return home with the following  A little help with walking and/or transfers;A little help with bathing/dressing/bathroom;Assistance with cooking/housework;Assist for transportation;Help with stairs or ramp for entrance    Equipment Recommendations None recommended by PT  Recommendations for Other Services  OT consult    Functional Status Assessment Patient has had a recent decline in their functional status and demonstrates the ability to make significant improvements in function in a reasonable and predictable amount of time.     Precautions / Restrictions Precautions Precautions: Fall Restrictions Weight Bearing Restrictions: No RLE Weight Bearing: Weight bearing as tolerated      Mobility  Bed Mobility Overal bed mobility: Needs Assistance Bed Mobility: Supine to Sit     Supine to sit: Min assist, Mod assist     General bed mobility comments: Cues for sequence and  use of L LE to self assist; physical assist to mange R LE and to control trunk    Transfers Overall transfer level: Needs assistance Equipment used: Rolling walker (2 wheels) Transfers: Sit to/from Stand, Bed to chair/wheelchair/BSC Sit to Stand: Min assist, Mod assist, From elevated surface   Step pivot transfers: Min assist, Mod assist       General transfer comment: cues for LE management and use of UEs to self assist; step pvt bed to [facility] to recliner    Ambulation/Gait               General Gait Details: step pvt only - pain limited  Stairs            Wheelchair Mobility    Modified Rankin (Stroke Patients Only)       Balance Overall balance assessment: Needs assistance Sitting-balance support: No upper extremity supported, Feet supported Sitting balance-Leahy Scale: Good     Standing balance support: Bilateral upper extremity supported Standing balance-Leahy Scale: Poor                               Pertinent Vitals/Pain Pain Assessment Pain Assessment: Faces Faces Pain Scale: Hurts whole lot Pain Location: R hip Pain Descriptors / Indicators: Aching, Grimacing, Guarding, Sore Pain Intervention(s): Limited activity within patient's tolerance, Monitored during session, Premedicated before session, Ice applied, Patient requesting pain meds-RN notified    Home Living Family/patient expects to be discharged to:: Unsure Living Arrangements: Children;Other relatives   Type of Home: Mobile home Home Access: Stairs to enter Entrance Stairs-Rails: Right Entrance Stairs-Number of Steps: 3   Home Layout: One  level Home Equipment: Agricultural consultant (2 wheels)      Prior Function Prior Level of Function : Independent/Modified Independent;History of Falls (last six months)             Mobility Comments: using RW for last several months since previous fall but states did not do well with it       Hand Dominance         Extremity/Trunk Assessment   Upper Extremity Assessment Upper Extremity Assessment: Overall WFL for tasks assessed    Lower Extremity Assessment Lower Extremity Assessment: RLE deficits/detail RLE Deficits / Details: AAROM at hip to 70 flex and 15 abd; RLE Coordination: WNL       Communication   Communication: No difficulties  Cognition Arousal/Alertness: Awake/alert Behavior During Therapy: WFL for tasks assessed/performed, Impulsive Overall Cognitive Status: Within Functional Limits for tasks assessed                                          General Comments      Exercises     Assessment/Plan    PT Assessment Patient needs continued PT services  PT Problem List Decreased strength;Decreased range of motion;Decreased activity tolerance;Decreased balance;Decreased mobility;Decreased knowledge of use of DME;Pain       PT Treatment Interventions DME instruction;Gait training;Stair training;Functional mobility training;Therapeutic activities;Therapeutic exercise;Patient/family education    PT Goals (Current goals can be found in the Care Plan section)  Acute Rehab PT Goals Patient Stated Goal: REhab to regain IND PT Goal Formulation: With patient Time For Goal Achievement: 04/10/23 Potential to Achieve Goals: Good    Frequency 7X/week     Co-evaluation               AM-PAC PT "6 Clicks" Mobility  Outcome Measure Help needed turning from your back to your side while in a flat bed without using bedrails?: A Lot Help needed moving from lying on your back to sitting on the side of a flat bed without using bedrails?: A Lot Help needed moving to and from a bed to a chair (including a wheelchair)?: A Lot Help needed standing up from a chair using your arms (e.g., wheelchair or bedside chair)?: A Lot Help needed to walk in hospital room?: Total Help needed climbing 3-5 steps with a railing? : Total 6 Click Score: 10    End of Session Equipment  Utilized During Treatment: Gait belt Activity Tolerance: Patient limited by pain Patient left: in chair;with call bell/phone within reach;with chair alarm set;with nursing/sitter in room Nurse Communication: Mobility status PT Visit Diagnosis: Difficulty in walking, not elsewhere classified (R26.2);Pain Pain - Right/Left: Right Pain - part of body: Hip    Time: 0850-0928 PT Time Calculation (min) (ACUTE ONLY): 38 min   Charges:   PT Evaluation $PT Eval Low Complexity: 1 Low PT Treatments $Therapeutic Exercise: 8-22 mins $Therapeutic Activity: 8-22 mins        Mauro Kaufmann PT Acute Rehabilitation Services Pager [phone] Office [phone]   Cassidy Stevens 03/28/2023, 11:10 AM

## 2023-03-28 NOTE — Plan of Care (Signed)

## 2023-03-28 NOTE — [place] FL2 (Signed)
[place] MEDICAID [id number] LEVEL OF CARE FORM     IDENTIFICATION  Patient Name: Cassidy Stevens Birthdate: 02/06/1961 Sex: female Admission Date (Current Location): 03/26/2023  [facility] and [place] Number:  Producer, television/film/video and Address:  [facility],  [address], [place] [address]      Provider Number: [id number]  Attending Physician Name and Address:  Elease Etienne, MD  Relative Name and Phone Number:       Current Level of Care: Hospital Recommended Level of Care: Skilled Nursing Facility Prior Approval Number:    Date Approved/Denied: 03/28/23 PASRR Number: [id number]  Discharge Plan: SNF    Current Diagnoses: Patient Active Problem List   Diagnosis Date Noted   Closed right hip fracture (HCC) 03/26/2023   Hypokalemia 03/26/2023   Fall at home, initial encounter 03/26/2023   Leukocytosis 03/26/2023   SIRS (systemic inflammatory response syndrome) (HCC) 03/26/2023   Cirrhosis (HCC) 03/26/2023   History of COPD 03/26/2023   Tobacco abuse 03/26/2023   Chronic hyponatremia 03/26/2023   Pain in both lower extremities 11/27/2021   Chronic midline low back pain without sciatica 11/27/2021   Chronic neck pain 11/27/2021    Orientation RESPIRATION BLADDER Height & Weight     Self, Time, Situation, Place  Normal Continent Weight: 177 lb 0.5 oz (80.3 kg) Height:  5\' 4"  (162.6 cm)  BEHAVIORAL SYMPTOMS/MOOD NEUROLOGICAL BOWEL NUTRITION STATUS      Continent    AMBULATORY STATUS COMMUNICATION OF NEEDS Skin   Limited Assist Verbally Normal                       Personal Care Assistance Level of Assistance  Bathing, Feeding, Dressing, Total care Bathing Assistance: Limited assistance Feeding assistance: Limited assistance Dressing Assistance: Limited assistance Total Care Assistance: Limited assistance   Functional Limitations Info  Sight, Hearing, Speech Sight Info: Adequate Hearing Info: Adequate Speech Info: Adequate     SPECIAL CARE FACTORS FREQUENCY  PT (By licensed PT)     PT Frequency: x5              Contractures Contractures Info: Not present    Additional Factors Info  Code Status, Allergies Code Status Info: Full Allergies Info: N/A           Current Medications (03/28/2023):  This is the current hospital active medication list Current Facility-Administered Medications  Medication Dose Route Frequency Provider Last Rate Last Admin   acetaminophen (TYLENOL) tablet 650 mg  650 mg Oral Q6H PRN Elodia Florence, PA-C   650 mg at 03/28/23 2841   Or   acetaminophen (TYLENOL) suppository 650 mg  650 mg Rectal Q6H PRN Elodia Florence, PA-C       alum & mag hydroxide-simeth (MAALOX/MYLANTA) 200-200-20 MG/5ML suspension 30 mL  30 mL Oral Q4H PRN Elodia Florence, PA-C       aspirin chewable tablet 81 mg  81 mg Oral BID Elodia Florence, PA-C   81 mg at 03/28/23 0816   bisacodyl (DULCOLAX) EC tablet 5 mg  5 mg Oral Daily PRN Elodia Florence, PA-C       butalbital-acetaminophen-caffeine (FIORICET) 50-325-40 MG per tablet 1 tablet  1 tablet Oral Daily PRN Elodia Florence, PA-C   1 tablet at 03/26/23 2214   diphenhydrAMINE (BENADRYL) 12.5 MG/5ML elixir 12.5-25 mg  12.5-25 mg Oral Q4H PRN Elodia Florence, PA-C   25 mg at 03/27/23 2325   docusate sodium (COLACE) capsule 100 mg  100 mg Oral BID  Elodia Florence, PA-C   100 mg at 03/28/23 0816   fluticasone furoate-vilanterol (BREO ELLIPTA) 200-25 MCG/ACT 1 puff  1 puff Inhalation Daily Elodia Florence, PA-C   1 puff at 03/28/23 0754   And   umeclidinium bromide (INCRUSE ELLIPTA) 62.5 MCG/ACT 1 puff  1 puff Inhalation Daily Elodia Florence, PA-C   1 puff at 03/28/23 0754   gabapentin (NEURONTIN) capsule 300 mg  300 mg Oral TID Elodia Florence, PA-C   300 mg at 03/28/23 0816   HYDROmorphone (DILAUDID) injection 1 mg  1 mg Intravenous Q3H PRN Anthoney Harada, NP   1 mg at 03/28/23 1034   levalbuterol (XOPENEX) nebulizer solution 1.25 mg  1.25 mg Nebulization Q4H PRN Elodia Florence, PA-C        melatonin tablet 3 mg  3 mg Oral QHS PRN Elodia Florence, PA-C   3 mg at 03/27/23 2032   menthol-cetylpyridinium (CEPACOL) lozenge 3 mg  1 lozenge Oral PRN Elodia Florence, PA-C       Or   phenol (CHLORASEPTIC) mouth spray 1 spray  1 spray Mouth/Throat PRN Elodia Florence, PA-C       methocarbamol (ROBAXIN) tablet 500 mg  500 mg Oral Q6H PRN Elodia Florence, PA-C   500 mg at 03/28/23 4098   Or   methocarbamol (ROBAXIN) 500 mg in dextrose 5 % 50 mL IVPB  500 mg Intravenous Q6H PRN Elodia Florence, PA-C       nicotine (NICODERM CQ - dosed in mg/24 hours) patch 14 mg  14 mg Transdermal Daily PRN Elodia Florence, PA-C   14 mg at 03/27/23 1745   ondansetron (ZOFRAN) injection 4 mg  4 mg Intravenous Q6H PRN Elodia Florence, PA-C       oxyCODONE (Oxy IR/ROXICODONE) immediate release tablet 10 mg  10 mg Oral Q4H PRN Elodia Florence, PA-C   10 mg at 03/28/23 1357   tiZANidine (ZANAFLEX) tablet 2 mg  2 mg Oral Q8H PRN Elodia Florence, PA-C   2 mg at 03/28/23 1357   Vitamin D (Ergocalciferol) (DRISDOL) 1.25 MG (50000 UNIT) capsule 50,000 Units  50,000 Units Oral Q7 days Elodia Florence, PA-C   50,000 Units at 03/27/23 1743     Discharge Medications: Please see discharge summary for a list of discharge medications.  Relevant Imaging Results:  Relevant Lab Results:   Additional Information SSN # [id number]  Georgie Chard, LCSW

## 2023-03-28 NOTE — Progress Notes (Signed)
Physical Therapy Treatment Patient Details Name: Cassidy Stevens MRN: [id number] DOB: November 14, 1960 Today's Date: 03/28/2023   History of Present Illness Pt s/p fall with R hip fx and now s/p R THR by anterior direct and with hx of Cirrhosis and COPD    PT Comments    Pt continues very cooperative and up to ambulate limited distance but continues pain limited.   Recommendations for follow up therapy are one component of a multi-disciplinary discharge planning process, led by the attending physician.  Recommendations may be updated based on patient status, additional functional criteria and insurance authorization.  Follow Up Recommendations  Can patient physically be transported by private vehicle: Yes    Assistance Recommended at Discharge Intermittent Supervision/Assistance  Patient can return home with the following A little help with walking and/or transfers;A little help with bathing/dressing/bathroom;Assistance with cooking/housework;Assist for transportation;Help with stairs or ramp for entrance   Equipment Recommendations  Rolling walker (2 wheels)    Recommendations for Other Services OT consult     Precautions / Restrictions Precautions Precautions: Fall Restrictions Weight Bearing Restrictions: No RLE Weight Bearing: Weight bearing as tolerated     Mobility  Bed Mobility Overal bed mobility: Needs Assistance Bed Mobility: Sit to Supine     Supine to sit: Min assist, Mod assist Sit to supine: Min assist, Mod assist   General bed mobility comments: Cues for sequence and use of L LE to self assist; physical assist to mange R LE and to control trunk    Transfers Overall transfer level: Needs assistance Equipment used: Rolling walker (2 wheels) Transfers: Sit to/from Stand Sit to Stand: Min assist, Mod assist, From elevated surface   Step pivot transfers: Min assist, Mod assist       General transfer comment: cues for LE management and use of UEs to  self assist;    Ambulation/Gait Ambulation/Gait assistance: Min assist, Mod assist Gait Distance (Feet): 35 Feet Assistive device: Rolling walker (2 wheels) Gait Pattern/deviations: Step-to pattern, Decreased step length - right, Decreased step length - left, Shuffle, Antalgic, Trunk flexed Gait velocity: decr     General Gait Details: cues for sequence, posture and position from [employer]             Wheelchair Mobility    Modified Rankin (Stroke Patients Only)       Balance Overall balance assessment: Needs assistance Sitting-balance support: No upper extremity supported, Feet supported Sitting balance-Leahy Scale: Good     Standing balance support: Bilateral upper extremity supported Standing balance-Leahy Scale: Poor                              Cognition Arousal/Alertness: Awake/alert Behavior During Therapy: WFL for tasks assessed/performed, Impulsive Overall Cognitive Status: Within Functional Limits for tasks assessed                                          Exercises      General Comments        Pertinent Vitals/Pain Pain Assessment Pain Assessment: 0-10 Pain Score: 8  Faces Pain Scale: Hurts whole lot Pain Location: R hip Pain Descriptors / Indicators: Aching, Grimacing, Guarding, Sore Pain Intervention(s): Limited activity within patient's tolerance, Monitored during session, Premedicated before session, Ice applied    Home Living  Prior Function            PT Goals (current goals can now be found in the care plan section) Acute Rehab PT Goals Patient Stated Goal: REhab to regain IND PT Goal Formulation: With patient Time For Goal Achievement: 04/10/23 Potential to Achieve Goals: Good Progress towards PT goals: Progressing toward goals    Frequency    7X/week      PT Plan Current plan remains appropriate    Co-evaluation              AM-PAC PT "6  Clicks" Mobility   Outcome Measure  Help needed turning from your back to your side while in a flat bed without using bedrails?: A Lot Help needed moving from lying on your back to sitting on the side of a flat bed without using bedrails?: A Lot Help needed moving to and from a bed to a chair (including a wheelchair)?: A Lot Help needed standing up from a chair using your arms (e.g., wheelchair or bedside chair)?: A Lot Help needed to walk in hospital room?: A Lot Help needed climbing 3-5 steps with a railing? : A Lot 6 Click Score: 12    End of Session Equipment Utilized During Treatment: Gait belt Activity Tolerance: Patient limited by pain Patient left: in bed;with call bell/phone within reach;with bed alarm set Nurse Communication: Mobility status PT Visit Diagnosis: Difficulty in walking, not elsewhere classified (R26.2);Pain Pain - Right/Left: Right Pain - part of body: Hip     Time: 1450-1506 PT Time Calculation (min) (ACUTE ONLY): 16 min  Charges:  $Gait Training: 8-22 mins                     Cassidy Stevens PT Acute Rehabilitation Services Pager [phone] Office [phone]    Cassidy Stevens 03/28/2023, 4:03 PM

## 2023-03-29 DIAGNOSIS — S72001D Fracture of unspecified part of neck of right femur, subsequent encounter for closed fracture with routine healing: Secondary | ICD-10-CM | POA: Diagnosis not present

## 2023-03-29 LAB — BASIC METABOLIC PANEL
Anion gap: 9 (ref 5–15)
BUN: 6 mg/dL — ABNORMAL LOW (ref 8–23)
CO2: 27 mmol/L (ref 22–32)
Calcium: 8.9 mg/dL (ref 8.9–10.3)
Chloride: 100 mmol/L (ref 98–111)
Creatinine, Ser: 0.55 mg/dL (ref 0.44–1.00)
GFR, Estimated: >60 mL/min (ref >60)
Glucose, Bld: 118 mg/dL — ABNORMAL HIGH (ref 70–99)
Potassium: 4.1 mmol/L (ref 3.5–5.1)
Sodium: 136 mmol/L (ref 135–145)

## 2023-03-29 LAB — CBC
HCT: 36.3 % (ref 36.0–46.0)
Hemoglobin: 12.7 g/dL (ref 12.0–15.0)
MCH: 32.2 pg (ref 26.0–34.0)
MCHC: 35 g/dL (ref 30.0–36.0)
MCV: 91.9 fL (ref 80.0–100.0)
Platelets: 208 10*3/uL (ref 150–400)
RBC: 3.95 MIL/uL (ref 3.87–5.11)
RDW: 12.4 % (ref 11.5–15.5)
WBC: 10.9 10*3/uL — ABNORMAL HIGH (ref 4.0–10.5)
nRBC: 0 % (ref 0.0–0.2)

## 2023-03-29 MED ORDER — ACETAMINOPHEN 500 MG PO TABS
1000.0000 mg | ORAL_TABLET | Freq: Three times a day (TID) | ORAL | Status: DC
  Administered 2023-03-29 – 2023-03-31 (×7): 1000 mg via ORAL
  Filled 2023-03-29 (×7): qty 2

## 2023-03-29 NOTE — Progress Notes (Signed)
PROGRESS NOTE   Cassidy Stevens  ZOX:[id number]    DOB: November 21, 1960    DOA: 03/26/2023  PCP: Remus Loffler, PA-C   I have briefly reviewed patients previous medical records in [facility]    Brief Narrative:  62 year old female, lives with extended family, ambulates with the help of walker but was also looking for a wheelchair, extensive PMH including COPD, ongoing tobacco use, cirrhosis, chronic hyponatremia, presented to the ED with complaints of right hip pain following ground-level fall at home.  Admitted for right femoral neck fracture.  Orthopedics consulted and s/p anterior right total hip arthroplasty 6/28 by Dr. Turner Daniels   Assessment & Plan:  Principal Problem:   Closed right hip fracture [facility]) Active Problems:   Hypokalemia   Fall at home, initial encounter   Leukocytosis   SIRS (systemic inflammatory response syndrome) (HCC)   Cirrhosis (HCC)   History of COPD   Tobacco abuse   Chronic hyponatremia   Right femoral neck fracture: - Sustained after ground-level mechanical fall at home. - As per orthopedic consultation, patient originally fractured her right femoral neck about a month ago at which time it was minimally displaced valgus fracture, THA was planned but postponed due to lumbar spine issues, now has further displacement of the right femoral neck fracture that needs surgical intervention. - Treat vitamin D deficiency as below.  Will need bone density scan as outpatient. - S/p right anterior total hip arthroplasty on 6/29 - Multimodality pain control.  Has been using round-the-clock IV/oral opioids and muscle relaxants.  Added scheduled Tylenol 1 g 3 times daily. - Orthopedic follow-up appreciated, recommend follow-up in office with Dr. Turner Daniels in 10-14 days postop, WBAT, aspirin 81 Mg twice daily x 2 weeks for postop DVT prophylaxis. - Pain reasonably controlled.  Ambulated 38 feet with PT.  PT and OT recommend SNF.  TOC on board.  Hopefully can DC  tomorrow  Acute posthemorrhagic anemia: - Surgical EBL: 400 mL - Hemoglobin dropped from 14.5 preop to 11.8 postop.  However hemoglobin has rebounded to 12.7.  COPD: - Does not use home oxygen. - No clinical bronchospasm. - Incentive spirometry and flutter valve - Continue home inhalers. - Chest x-ray negative  Hypokalemia: - Replaced.  Magnesium 2.  Tobacco use disorder: - Cessation counseled.  Continue nicotine patch.  Chronic hyponatremia: - Clinically euvolemic.  Resolved today.  Vitamin D deficiency: - Vitamin D 25-hydroxy: 9.67 - Started vitamin D 50 K units weekly.  Body mass index is 30.92 kg/m./Obesity - Outpatient follow-up.   ACP Documents: None present DVT prophylaxis: SCDs Start: 03/27/23 1715 Place TED hose Start: 03/27/23 1519 SCDs Start: 03/26/23 2007     Code Status: Full Code:  Family Communication: None at bedside Disposition:  Inpatient, immediate postop, pending PT evaluation, possible DC 7/1, may be to SNF     Consultants:   Orthopedics  Procedures:   As above  Antimicrobials:      Subjective:  Right hip pain much better than preop.  Has appropriate postop right hip pain but controlled on current regimen.  However has been using almost around-the-clock p.o./IV opioids, muscle relaxants along with Tylenol.  Objective:   Vitals:   03/29/23 0456 03/29/23 0500 03/29/23 0804 03/29/23 1325  BP: 114/64   109/62  Pulse: 85   83  Resp: 18   16  Temp: 98.2 F (36.8 C)   98 F (36.7 C)  TempSrc: Oral   Oral  SpO2: 98%  94% 98%  Weight:  81.7 kg    Height:        General exam: Middle-age female, looks older than stated age, lying comfortably propped up in bed without distress.  Oral mucosa moist.  Edentulous. Respiratory system: Clear to auscultation without wheezing, rhonchi or crackles.  No increased work of breathing. Cardiovascular system: S1 & S2 heard, RRR. No JVD, murmurs, rubs, gallops or clicks. No pedal edema.    Gastrointestinal system: Abdomen is nondistended, soft and nontender. No organomegaly or masses felt. Normal bowel sounds heard. Central nervous system: Alert and oriented. No focal neurological deficits. Extremities: Symmetric 5 x 5 power.  Right hip surgical site skin clean and dry.  Appropriate mild swelling around surgical site.  No acute findings. Skin: No rashes, lesions or ulcers Psychiatry: Judgement and insight appear normal. Mood & affect appropriate.     Data Reviewed:   I have personally reviewed following labs and imaging studies   CBC: Recent Labs  Lab 03/26/23 1746 03/27/23 0341 03/28/23 0415 03/29/23 0336  WBC 12.1* 9.2 9.7 10.9*  NEUTROABS 8.9* 5.3  --   --   HGB 13.7 14.5 11.8* 12.7  HCT 39.1 41.5 33.1* 36.3  MCV 90.5 90.2 89.9 91.9  PLT 245 264 185 208    Basic Metabolic Panel: Recent Labs  Lab 03/26/23 1746 03/27/23 0341 03/28/23 0415 03/29/23 0336  NA 131* 132* 128* 136  K 3.2* 4.2 3.8 4.1  CL 100 101 96* 100  CO2 21* 22 25 27   GLUCOSE 94 111* 122* 118*  BUN 8 5* 5* 6*  CREATININE 0.38* 0.51 0.44 0.55  CALCIUM 8.0* 9.1 8.2* 8.9  MG  --  2.0  --   --   PHOS  --  4.9*  --   --     Liver Function Tests: Recent Labs  Lab 03/27/23 0341  AST 15  ALT 15  ALKPHOS 129*  BILITOT 0.7  PROT 7.6  ALBUMIN 4.0    CBG: No results for input(s): "GLUCAP" in the last 168 hours.  Microbiology Studies:   Recent Results (from the past 240 hour(s))  Surgical PCR screen     Status: None   Collection Time: 03/27/23  5:55 AM   Specimen: Nasal Mucosa; Nasal Swab  Result Value Ref Range Status   MRSA, PCR NEGATIVE NEGATIVE Final   Staphylococcus aureus NEGATIVE NEGATIVE Final    Comment: (NOTE) The Xpert SA Assay (FDA approved for NASAL specimens in patients 35 years of age and older), is one component of a comprehensive surveillance program. It is not intended to diagnose infection nor to guide or monitor treatment. Performed at [facility], [address], [place], [place] [address]     Radiology Studies:  No results found.  Scheduled Meds:    acetaminophen  1,000 mg Oral TID   aspirin  81 mg Oral BID   docusate sodium  100 mg Oral BID   fluticasone furoate-vilanterol  1 puff Inhalation Daily   And   umeclidinium bromide  1 puff Inhalation Daily   gabapentin  300 mg Oral TID   Vitamin D (Ergocalciferol)  50,000 Units Oral Q7 days    Continuous Infusions:    methocarbamol (ROBAXIN) IV       LOS: 3 days     Marcellus Scott, MD,  FACP, FHM, SFHM, [facility], [facility]   [facility] [place]     To contact the attending provider between 7A-7P or the covering provider during after hours  7P-7A, please log into the web site [email or web address] and access using universal [place] password for that web site. If you do not have the password, please call the hospital operator.  03/29/2023, 3:14 PM

## 2023-03-29 NOTE — Progress Notes (Signed)
Physical Therapy Treatment Patient Details Name: Cassidy Stevens MRN: [id number] DOB: 01/09/1961 Today's Date: 03/29/2023   History of Present Illness Pt s/p fall with R hip fx and now s/p R THR by anterior direct and with hx of Cirrhosis and COPD    PT Comments    Pt continues very cooperative and progressing with mobility including decreasing level of assist for most tasks but limited by pain and fatigues easily.     Recommendations for follow up therapy are one component of a multi-disciplinary discharge planning process, led by the attending physician.  Recommendations may be updated based on patient status, additional functional criteria and insurance authorization.  Follow Up Recommendations  Can patient physically be transported by private vehicle: Yes    Assistance Recommended at Discharge Intermittent Supervision/Assistance  Patient can return home with the following A little help with walking and/or transfers;A little help with bathing/dressing/bathroom;Assistance with cooking/housework;Assist for transportation;Help with stairs or ramp for entrance   Equipment Recommendations  Rolling walker (2 wheels)    Recommendations for Other Services OT consult     Precautions / Restrictions Precautions Precautions: Fall Restrictions Weight Bearing Restrictions: No RLE Weight Bearing: Weight bearing as tolerated     Mobility  Bed Mobility Overal bed mobility: Needs Assistance Bed Mobility: Supine to Sit     Supine to sit: Min assist     General bed mobility comments: Cues for sequence and use of L LE to self assist; physical assist to mange R LE and to control trunk    Transfers Overall transfer level: Needs assistance Equipment used: Rolling walker (2 wheels) Transfers: Sit to/from Stand Sit to Stand: Min assist           General transfer comment: cues for LE management and use of UEs to self assist;    Ambulation/Gait Ambulation/Gait assistance: Min  assist Gait Distance (Feet): 38 Feet Assistive device: Rolling walker (2 wheels) Gait Pattern/deviations: Step-to pattern, Decreased step length - right, Decreased step length - left, Shuffle, Antalgic, Trunk flexed Gait velocity: decr     General Gait Details: Increased time with cues for sequence, posture and position from RW;  distance limited by fatigue/pain   Stairs             Wheelchair Mobility    Modified Rankin (Stroke Patients Only)       Balance Overall balance assessment: Needs assistance Sitting-balance support: No upper extremity supported, Feet supported Sitting balance-Leahy Scale: Good     Standing balance support: Single extremity supported Standing balance-Leahy Scale: Poor                              Cognition Arousal/Alertness: Awake/alert Behavior During Therapy: WFL for tasks assessed/performed, Impulsive Overall Cognitive Status: Within Functional Limits for tasks assessed                                          Exercises      General Comments        Pertinent Vitals/Pain Pain Assessment Pain Assessment: 0-10 Pain Score: 6  Pain Location: R hip Pain Descriptors / Indicators: Aching, Grimacing, Guarding, Sore Pain Intervention(s): Limited activity within patient's tolerance, Monitored during session, Premedicated before session, Ice applied    Home Living  Prior Function            PT Goals (current goals can now be found in the care plan section) Acute Rehab PT Goals Patient Stated Goal: REhab to regain IND PT Goal Formulation: With patient Time For Goal Achievement: 04/10/23 Potential to Achieve Goals: Good Progress towards PT goals: Progressing toward goals    Frequency    7X/week      PT Plan Current plan remains appropriate    Co-evaluation              AM-PAC PT "6 Clicks" Mobility   Outcome Measure  Help needed turning from your  back to your side while in a flat bed without using bedrails?: A Little Help needed moving from lying on your back to sitting on the side of a flat bed without using bedrails?: A Little Help needed moving to and from a bed to a chair (including a wheelchair)?: A Little Help needed standing up from a chair using your arms (e.g., wheelchair or bedside chair)?: A Little Help needed to walk in hospital room?: A Little Help needed climbing 3-5 steps with a railing? : A Lot 6 Click Score: 17    End of Session Equipment Utilized During Treatment: Gait belt Activity Tolerance: Patient limited by pain;Patient limited by fatigue Patient left: in chair;with call bell/phone within reach;with chair alarm set Nurse Communication: Mobility status PT Visit Diagnosis: Difficulty in walking, not elsewhere classified (R26.2);Pain Pain - Right/Left: Right Pain - part of body: Hip     Time: 1610-9604 PT Time Calculation (min) (ACUTE ONLY): 23 min  Charges:  $Gait Training: 8-22 mins $Therapeutic Exercise: 8-22 mins                     Mauro Kaufmann PT Acute Rehabilitation Services Pager [phone] Office [phone]    [facility] 03/29/2023, 12:59 PM

## 2023-03-29 NOTE — Progress Notes (Signed)
PATIENT ID: Cassidy Stevens Ales  MRN: [id number]  DOB/AGE:  [birth year] 62 y.o.  2 Days Post-Op Procedure(s) (LRB): TOTAL HIP ARTHROPLASTY ANTERIOR APPROACH (Right)    PROGRESS NOTE Subjective: Patient is alert, oriented, no Nausea, no Vomiting, yes passing gas, . Taking PO well. Denies SOB, Chest or Calf Pain. Using Incentive Spirometer, PAS in place. Ambulate 19' WBAT Patient reports pain as  4/10  .    Objective: Vital signs in last 24 hours: Vitals:   03/28/23 1433 03/28/23 2144 03/29/23 0456 03/29/23 0500  BP: 108/74 114/78 114/64   Pulse: 95 90 85   Resp: 18 18 18    Temp: 98.4 F (36.9 C) 98.7 F (37.1 C) 98.2 F (36.8 C)   TempSrc: Oral Oral Oral   SpO2: 100% 98% 98%   Weight:    81.7 kg  Height:          Intake/Output from previous day: I/O last 3 completed shifts: In: 2641.1 [P.O.:1320; I.V.:1221.1; IV Piggyback:100] Out: 3450 [Urine:3450]   Intake/Output this shift: No intake/output data recorded.   LABORATORY DATA: Recent Labs    03/27/23 0341 03/28/23 0415 03/29/23 0336  WBC 9.2 9.7 10.9*  HGB 14.5 11.8* 12.7  HCT 41.5 33.1* 36.3  PLT 264 185 208  NA 132* 128* 136  K 4.2 3.8 4.1  CL 101 96* 100  CO2 22 25 27   BUN 5* 5* 6*  CREATININE 0.51 0.44 0.55  GLUCOSE 111* 122* 118*  INR 1.0  --   --   CALCIUM 9.1 8.2* 8.9    Examination: Neurologically intact ABD soft Neurovascular intact Sensation intact distally Intact pulses distally Dorsiflexion/Plantar flexion intact Incision: dressing C/D/I No cellulitis present Compartment soft} XR AP&Lat of hip shows well placed\fixed THA  Assessment:   2 Days Post-Op Procedure(s) (LRB): TOTAL HIP ARTHROPLASTY ANTERIOR APPROACH (Right) ADDITIONAL DIAGNOSIS:  Expected Acute Blood Loss, PTSD, hx ETOH abuse, tobacco abuse, obesity Anemia, chronic pain, cirhosis,   Plan: PT/OT WBAT, THA  DVT Prophylaxis: SCDx72 hrs, ASA 81 mg BID x 2 weeks  DISCHARGE PLAN: Skilled Nursing Facility/Rehab, family  ststes they cannot handle her at home because of falls fron night terrors, deconditioning.   DISCHARGE NEEDS: HHPT, Walker, and 3-in-1 comode seat. F/U with me in 10 to 14 days. R/x on chart for ASA and pain meds.   Patient ID: Cassidy Stevens, female   DOB: [birth year], 62 y.o.   MRN: [id number]

## 2023-03-29 NOTE — Plan of Care (Signed)
  Problem: Clinical Measurements: Goal: Ability to maintain clinical measurements within normal limits will improve Outcome: Progressing Goal: Will remain free from infection Outcome: Progressing Goal: Diagnostic test results will improve Outcome: Progressing Goal: Respiratory complications will improve Outcome: Progressing Goal: Cardiovascular complication will be avoided Outcome: Progressing   Problem: Activity: Goal: Risk for activity intolerance will decrease Outcome: Progressing   Problem: Coping: Goal: Level of anxiety will decrease Outcome: Progressing   Problem: Elimination: Goal: Will not experience complications related to bowel motility Outcome: Progressing Goal: Will not experience complications related to urinary retention Outcome: Progressing   

## 2023-03-29 NOTE — Evaluation (Signed)
Occupational Therapy Evaluation Patient Details Name: Cassidy Stevens MRN: [id number] DOB: 1961-07-12 Today's Date: 03/29/2023   History of Present Illness Pt s/p fall with resultant R hip fx and now s/p R THR by anterior direct and with hx of cirrhosis and COPD   Clinical Impression   The pt is currently presenting below her baseline level of functioning for self-care management, given below listed deficits (see OT problem list at end of this note). She currently requires assist for tasks, such as bed mobility, sit to stand, lower body dressing, and toileting at bathroom level. She reported having moderate R hip pain and soreness with activity.  She will benefit from further OT services to maximize her independence with self-care tasks and to decrease the risk for restricted participation in meaningful activities. She indicated she may not have adequate assistance available at home. As such, she may benefit from short-term sub-acute rehab, prior to her return home.      Recommendations for follow up therapy are one component of a multi-disciplinary discharge planning process, led by the attending physician.  Recommendations may be updated based on patient status, additional functional criteria and insurance authorization.   Assistance Recommended at Discharge Intermittent Supervision/Assistance  Patient can return home with the following A little help with walking and/or transfers;Assistance with cooking/housework;A little help with bathing/dressing/bathroom;Help with stairs or ramp for entrance    Functional Status Assessment  Patient has had a recent decline in their functional status and demonstrates the ability to make significant improvements in function in a reasonable and predictable amount of time.  Equipment Recommendations  Other (comment) (defer to next level of care)       Precautions / Restrictions Precautions Precautions: Fall Restrictions Weight Bearing Restrictions:  No RLE Weight Bearing: Weight bearing as tolerated      Mobility Bed Mobility Overal bed mobility: Needs Assistance Bed Mobility: Supine to Sit     Supine to sit: Min assist Sit to supine: Min assist        Transfers Overall transfer level: Needs assistance Equipment used: Rolling walker (2 wheels) Transfers: Sit to/from Stand Sit to Stand: Min assist, From elevated surface                  Balance Overall balance assessment: Needs assistance   Sitting balance-Leahy Scale: Good         Standing balance comment: min guard to min assist with RW           ADL either performed or assessed with clinical judgement   ADL Overall ADL's : Needs assistance/impaired Eating/Feeding: Independent;Sitting   Grooming: Set up;Min guard;Sitting;Standing Grooming Details (indicate cue type and reason): set-up seated or min guard standing         Upper Body Dressing : Set up;Sitting   Lower Body Dressing: Moderate assistance;Sit to/from stand   Toilet Transfer: Minimal assistance;Rolling walker (2 wheels);Ambulation;Regular Teacher, adult education Details (indicate cue type and reason): performed at bathroom level Toileting- Clothing Manipulation and Hygiene: Minimal assistance;Sit to/from stand Toileting - Clothing Manipulation Details (indicate cue type and reason): at bathroom level             Vision   Additional Comments: she correctly read the time depicted on the wall clock            Pertinent Vitals/Pain Pain Assessment Pain Assessment: 0-10 Pain Score: 6  Pain Location: R hip Pain Intervention(s): Limited activity within patient's tolerance, Monitored during session, Repositioned     Hand  Dominance     Extremity/Trunk Assessment Upper Extremity Assessment Upper Extremity Assessment: Overall WFL for tasks assessed           Communication Communication Communication: No difficulties   Cognition Arousal/Alertness: Awake/alert Behavior  During Therapy: WFL for tasks assessed/performed Overall Cognitive Status: Within Functional Limits for tasks assessed                       Home Living Family/patient expects to be discharged to:: Unsure Living Arrangements: Children;Other relatives (son, daughter-in-law, and 3 young grandkids)   Type of Home: Mobile home Home Access: Stairs to enter Entrance Stairs-Number of Steps: 4   Home Layout: One level               Home Equipment: Agricultural consultant (2 wheels) (wide)          Prior Functioning/Environment Prior Level of Function : Independent/Modified Independent             Mobility Comments: using RW for last several months since previous fall but states did not do well with it ADLs Comments: She was independent with ADLs, driving, cooking, and cleaning.        OT Problem List: Decreased strength;Impaired balance (sitting and/or standing);Decreased knowledge of use of DME or AE;Pain      OT Treatment/Interventions: Self-care/ADL training;Therapeutic exercise;Energy conservation;DME and/or AE instruction;Therapeutic activities;Balance training;Patient/family education    OT Goals(Current goals can be found in the care plan section) Acute Rehab OT Goals OT Goal Formulation: With patient Time For Goal Achievement: 04/12/23 Potential to Achieve Goals: Good ADL Goals Pt Will Perform Grooming: with supervision;standing Pt Will Perform Lower Body Dressing: with supervision;sit to/from stand Pt Will Transfer to Toilet: with supervision;ambulating Pt Will Perform Toileting - Clothing Manipulation and hygiene: with supervision;sit to/from stand  OT Frequency: Min 1X/week       AM-PAC OT "6 Clicks" Daily Activity     Outcome Measure Help from another person eating meals?: None Help from another person taking care of personal grooming?: A Little Help from another person toileting, which includes using toliet, bedpan, or urinal?: A Little Help from  another person bathing (including washing, rinsing, drying)?: A Lot Help from another person to put on and taking off regular upper body clothing?: A Little Help from another person to put on and taking off regular lower body clothing?: A Lot 6 Click Score: 17   End of Session Equipment Utilized During Treatment: Gait belt;Rolling walker (2 wheels) Nurse Communication: Mobility status  Activity Tolerance: Patient limited by pain Patient left: in bed;with call bell/phone within reach;with bed alarm set  OT Visit Diagnosis: Pain;Unsteadiness on feet (R26.81);History of falling (Z91.81) Pain - Right/Left: Right Pain - part of body: Hip                Time: 7846-9629 OT Time Calculation (min): 22 min Charges:  OT General Charges $OT Visit: 1 Visit OT Evaluation $OT Eval Moderate Complexity: 1 Mod    Cassidy Stevens, OTR/L 03/29/2023, 2:10 PM

## 2023-03-29 NOTE — Progress Notes (Signed)
Physical Therapy Treatment Patient Details Name: Cassidy Stevens MRN: [id number] DOB: November 06, 1960 Today's Date: 03/29/2023   History of Present Illness Pt s/p fall with R hip fx and now s/p R THR by anterior direct and with hx of Cirrhosis and COPD    PT Comments    Pt continues cooperative and with noted improvement this pm in level of assist needed for most tasks.  Pt up to ambulate limited distance in hall and up to bathroom for toileting and hygiene standing at sink.     Recommendations for follow up therapy are one component of a multi-disciplinary discharge planning process, led by the attending physician.  Recommendations may be updated based on patient status, additional functional criteria and insurance authorization.  Follow Up Recommendations  Can patient physically be transported by private vehicle: Yes    Assistance Recommended at Discharge Intermittent Supervision/Assistance  Patient can return home with the following A little help with walking and/or transfers;A little help with bathing/dressing/bathroom;Assistance with cooking/housework;Assist for transportation;Help with stairs or ramp for entrance   Equipment Recommendations  Rolling walker (2 wheels)    Recommendations for Other Services OT consult     Precautions / Restrictions Precautions Precautions: Fall Restrictions Weight Bearing Restrictions: No RLE Weight Bearing: Weight bearing as tolerated     Mobility  Bed Mobility Overal bed mobility: Needs Assistance Bed Mobility: Supine to Sit, Sit to Supine     Supine to sit: Min guard Sit to supine: Min guard   General bed mobility comments: Increased time with cues for sequence and use of L LE and gait belt to self assist;    Transfers Overall transfer level: Needs assistance Equipment used: Rolling walker (2 wheels) Transfers: Sit to/from Stand Sit to Stand: Min assist, Min guard           General transfer comment: cues for LE  management and use of UEs to self assist;    Ambulation/Gait Ambulation/Gait assistance: Min assist, Min guard Gait Distance (Feet): 38 Feet (and additional 15' into bathroom) Assistive device: Rolling walker (2 wheels) Gait Pattern/deviations: Step-to pattern, Decreased step length - right, Decreased step length - left, Shuffle, Antalgic, Trunk flexed Gait velocity: decr     General Gait Details: Increased time with cues for sequence, posture, stride length and position from RW;  distance limited by fatigue/pain   Stairs             Wheelchair Mobility    Modified Rankin (Stroke Patients Only)       Balance Overall balance assessment: Needs assistance Sitting-balance support: No upper extremity supported, Feet supported Sitting balance-Leahy Scale: Good     Standing balance support: No upper extremity supported Standing balance-Leahy Scale: Fair                              Cognition Arousal/Alertness: Awake/alert Behavior During Therapy: WFL for tasks assessed/performed Overall Cognitive Status: Within Functional Limits for tasks assessed                                          Exercises      General Comments        Pertinent Vitals/Pain Pain Assessment Pain Assessment: 0-10 Pain Score: 8  Pain Location: R hip Pain Descriptors / Indicators: Aching, Grimacing, Guarding, Sore Pain Intervention(s): Limited activity within patient's tolerance, Monitored during  session, Premedicated before session, Ice applied, Patient requesting pain meds-RN notified    Home Living Family/patient expects to be discharged to:: Unsure Living Arrangements: Children;Other relatives (son, daughter-in-law, and 3 young grandkids)   Type of Home: Mobile home Home Access: Stairs to enter   Entrance Stairs-Number of Steps: 4   Home Layout: One level Home Equipment: Agricultural consultant (2 wheels) (wide)      Prior Function            PT Goals  (current goals can now be found in the care plan section) Acute Rehab PT Goals Patient Stated Goal: REhab to regain IND PT Goal Formulation: With patient Time For Goal Achievement: 04/10/23 Potential to Achieve Goals: Good Progress towards PT goals: Progressing toward goals    Frequency    7X/week      PT Plan Current plan remains appropriate    Co-evaluation              AM-PAC PT "6 Clicks" Mobility   Outcome Measure  Help needed turning from your back to your side while in a flat bed without using bedrails?: A Little Help needed moving from lying on your back to sitting on the side of a flat bed without using bedrails?: A Little Help needed moving to and from a bed to a chair (including a wheelchair)?: A Little Help needed standing up from a chair using your arms (e.g., wheelchair or bedside chair)?: A Little Help needed to walk in hospital room?: A Little Help needed climbing 3-5 steps with a railing? : A Lot 6 Click Score: 17    End of Session Equipment Utilized During Treatment: Gait belt Activity Tolerance: Patient limited by pain;Patient limited by fatigue Patient left: in bed;with call bell/phone within reach;with bed alarm set Nurse Communication: Mobility status PT Visit Diagnosis: Difficulty in walking, not elsewhere classified (R26.2);Pain Pain - Right/Left: Right Pain - part of body: Hip     Time: 5784-6962 PT Time Calculation (min) (ACUTE ONLY): 19 min  Charges:  $Gait Training: 8-22 mins $Therapeutic Exercise: 8-22 mins                     Mauro Kaufmann PT Acute Rehabilitation Services Pager [phone] Office [phone]    Cassidy Stevens 03/29/2023, 3:41 PM

## 2023-03-29 NOTE — Plan of Care (Signed)
  Problem: Pain Managment: Goal: General experience of comfort will improve Outcome: Progressing   Problem: Safety: Goal: Ability to remain free from injury will improve Outcome: Progressing   Problem: Coping: Goal: Level of anxiety will decrease Outcome: Progressing   

## 2023-03-30 ENCOUNTER — Encounter (HOSPITAL_COMMUNITY): Payer: Self-pay | Admitting: Orthopedic Surgery

## 2023-03-30 DIAGNOSIS — W19XXXA Unspecified fall, initial encounter: Secondary | ICD-10-CM | POA: Diagnosis not present

## 2023-03-30 DIAGNOSIS — E871 Hypo-osmolality and hyponatremia: Secondary | ICD-10-CM | POA: Diagnosis not present

## 2023-03-30 DIAGNOSIS — S72001D Fracture of unspecified part of neck of right femur, subsequent encounter for closed fracture with routine healing: Secondary | ICD-10-CM | POA: Diagnosis not present

## 2023-03-30 DIAGNOSIS — K7460 Unspecified cirrhosis of liver: Secondary | ICD-10-CM | POA: Diagnosis not present

## 2023-03-30 LAB — BASIC METABOLIC PANEL
Anion gap: 10 (ref 5–15)
BUN: <5 mg/dL — ABNORMAL LOW (ref 8–23)
CO2: 24 mmol/L (ref 22–32)
Calcium: 8.7 mg/dL — ABNORMAL LOW (ref 8.9–10.3)
Chloride: 99 mmol/L (ref 98–111)
Creatinine, Ser: 0.42 mg/dL — ABNORMAL LOW (ref 0.44–1.00)
GFR, Estimated: >60 mL/min (ref >60)
Glucose, Bld: 111 mg/dL — ABNORMAL HIGH (ref 70–99)
Potassium: 3.9 mmol/L (ref 3.5–5.1)
Sodium: 133 mmol/L — ABNORMAL LOW (ref 135–145)

## 2023-03-30 LAB — CBC
HCT: 34 % — ABNORMAL LOW (ref 36.0–46.0)
Hemoglobin: 11.6 g/dL — ABNORMAL LOW (ref 12.0–15.0)
MCH: 31.2 pg (ref 26.0–34.0)
MCHC: 34.1 g/dL (ref 30.0–36.0)
MCV: 91.4 fL (ref 80.0–100.0)
Platelets: 223 10*3/uL (ref 150–400)
RBC: 3.72 MIL/uL — ABNORMAL LOW (ref 3.87–5.11)
RDW: 12.1 % (ref 11.5–15.5)
WBC: 10.5 10*3/uL (ref 4.0–10.5)
nRBC: 0 % (ref 0.0–0.2)

## 2023-03-30 MED ORDER — GABAPENTIN 300 MG PO CAPS
600.0000 mg | ORAL_CAPSULE | Freq: Three times a day (TID) | ORAL | Status: DC
  Administered 2023-03-30 – 2023-03-31 (×2): 600 mg via ORAL
  Filled 2023-03-30 (×2): qty 2

## 2023-03-30 NOTE — Progress Notes (Signed)
Physical Therapy Treatment Patient Details Name: Cassidy Stevens MRN: [id number] DOB: Nov 30, 1960 Today's Date: 03/30/2023   History of Present Illness Pt s/p fall with R hip fx and now s/p R THR by anterior direct and with hx of Cirrhosis and COPD    PT Comments  Pt  is progressing this session however remains limited by fatigue/pain; will benefit from post acute rehab     Assistance Recommended at Discharge Intermittent Supervision/Assistance  If plan is discharge home, recommend the following:  Can travel by private vehicle    A little help with walking and/or transfers;A little help with bathing/dressing/bathroom;Assistance with cooking/housework;Assist for transportation;Help with stairs or ramp for entrance   Yes  Equipment Recommendations  Rolling walker (2 wheels)    Recommendations for Other Services       Precautions / Restrictions Precautions Precautions: Fall Restrictions Weight Bearing Restrictions: No RLE Weight Bearing: Weight bearing as tolerated     Mobility  Bed Mobility Overal bed mobility: Needs Assistance Bed Mobility: Supine to Sit, Sit to Supine     Supine to sit: Supervision Sit to supine: Supervision   General bed mobility comments: Increased time with cues for sequence and use of L LE and gait belt to self assist; no physical assist    Transfers Overall transfer level: Needs assistance Equipment used: Rolling walker (2 wheels) Transfers: Sit to/from Stand Sit to Stand: Min guard, Supervision                Ambulation/Gait Ambulation/Gait assistance: Min guard Gait Distance (Feet): 50 Feet Assistive device: Rolling walker (2 wheels) Gait Pattern/deviations: Step-to pattern, Decreased step length - right, Decreased step length - left, Decreased stance time - right       General Gait Details: Increased time with cues for sequence, posture, stride length and position from RW;  distance limited by fatigue/pain   Stairs              Wheelchair Mobility     Tilt Bed    Modified Rankin (Stroke Patients Only)       Balance                                            Cognition Arousal/Alertness: Awake/alert Behavior During Therapy: WFL for tasks assessed/performed Overall Cognitive Status: Within Functional Limits for tasks assessed                                          Exercises Total Joint Exercises Ankle Circles/Pumps: AROM, Both, 5 reps Heel Slides: Right, 10 reps, AROM, AAROM (pt self assisting)    General Comments        Pertinent Vitals/Pain Pain Assessment Pain Assessment: Faces Faces Pain Scale: Hurts little more Pain Location: R hip Pain Descriptors / Indicators: Aching, Grimacing, Guarding, Sore Pain Intervention(s): Limited activity within patient's tolerance, Monitored during session, Premedicated before session, Repositioned    Home Living                          Prior Function            PT Goals (current goals can now be found in the care plan section) Acute Rehab PT Goals Patient Stated Goal: REhab to regain IND PT  Goal Formulation: With patient Time For Goal Achievement: 04/10/23 Potential to Achieve Goals: Good Progress towards PT goals: Progressing toward goals    Frequency    7X/week      PT Plan Current plan remains appropriate    Co-evaluation              AM-PAC PT "6 Clicks" Mobility   Outcome Measure  Help needed turning from your back to your side while in a flat bed without using bedrails?: A Little Help needed moving from lying on your back to sitting on the side of a flat bed without using bedrails?: A Little Help needed moving to and from a bed to a chair (including a wheelchair)?: A Little Help needed standing up from a chair using your arms (e.g., wheelchair or bedside chair)?: A Little Help needed to walk in hospital room?: A Little Help needed climbing 3-5 steps with a  railing? : A Little 6 Click Score: 18    End of Session Equipment Utilized During Treatment: Gait belt Activity Tolerance: Patient tolerated treatment well;Patient limited by fatigue;Patient limited by pain Patient left: in bed;with call bell/phone within reach;Other (comment) (pt going to [facility] I'ly per pt and staff report) Nurse Communication: Mobility status PT Visit Diagnosis: Difficulty in walking, not elsewhere classified (R26.2);Pain Pain - Right/Left: Right Pain - part of body: Hip     Time: 9147-8295 PT Time Calculation (min) (ACUTE ONLY): 12 min  Charges:    $Gait Training: 8-22 mins PT General Charges $$ ACUTE PT VISIT: 1 Visit                     Cassidy Stevens, PT  Acute Rehab Dept ([facility]) [phone]  03/30/2023    [facility] 03/30/2023, 4:08 PM

## 2023-03-30 NOTE — Progress Notes (Signed)
PT Cancellation Note  Patient Details Name: Cassidy Stevens MRN: [id number] DOB: [birth year]   Cancelled Treatment:    Reason Eval/Treat Not Completed: Pain limiting ability to participate   [facility] 03/30/2023, 11:41 AM

## 2023-03-30 NOTE — TOC Progression Note (Addendum)
Transition of Care [facility]) - Progression Note    Patient Details  Name: Cassidy Stevens MRN: [id number] Date of Birth: [birth year]  Transition of Care [facility]) CM/SW Contact  Coralyn Helling, LCSW Phone Number: 03/30/2023, 10:31 AM  Clinical Narrative:   Bed offers given to daughter, Cassidy Stevens via email [email or web address]. Awaiting bed choice.   12:43 PM Family select [facility]    Expected Discharge Plan: Skilled Nursing Facility Barriers to Discharge: No Barriers Identified  Expected Discharge Plan and Services       Living arrangements for the past 2 months: Single Family Home                                       Social Determinants of Health (SDOH) Interventions SDOH Screenings   Food Insecurity: No Food Insecurity (03/26/2023)  Housing: Low Risk  (03/26/2023)  Transportation Needs: No Transportation Needs (03/26/2023)  Utilities: Not At Risk (03/26/2023)  Depression (PHQ2-9): Low Risk  (03/11/2023)  Tobacco Use: High Risk (03/30/2023)    Readmission Risk Interventions    03/28/2023    2:52 PM  Readmission Risk Prevention Plan  Transportation Screening Complete  PCP or Specialist Appt within 5-7 Days Complete  Home Care Screening Complete  Medication Review (RN CM) Complete

## 2023-03-30 NOTE — Progress Notes (Signed)
[facility]  PROGRESS NOTE  Cassidy Stevens ZOX:[id number] DOB: [birth year] DOA: 03/26/2023 PCP: Remus Loffler, PA-C   Brief HPI:   62 year old female, lives with extended family, ambulates with the help of walker but was also looking for a wheelchair, extensive PMH including COPD, ongoing tobacco use, cirrhosis, chronic hyponatremia, presented to the ED with complaints of right hip pain following ground-level fall at home. Admitted for right femoral neck fracture. Orthopedics consulted and s/p anterior right total hip arthroplasty 6/28 by Dr. Turner Daniels     Assessment/Plan:   Right femoral neck fracture -Sustained after ground-level mechanical fall at home -As per orthopedic consultation, patient originally fractured her right femoral neck about a month ago at which time it was minimally displaced valgus fracture, THA was planned but postponed due to lumbar spine issues, now has further displacement of the right femoral neck fracture that needs surgical intervention. -Treatment of vitamin D deficiency as below, will need bone density scan as outpatient -S/p right anterior total hip arthroplasty on 6/29 -Orthopedic recommend follow-up in office with Dr. Turner Daniels in 10 to 14 days postop, WBAT, aspirin 81 mg p.o. twice daily for 2 weeks for postop DVT prophylaxis -Awaiting bed at skilled nursing facility  Acute posthemorrhagic anemia -EBL 400 mL -Hemoglobin dropped from 14.5 preop to 11.8 postop -Today hemoglobin is 11.6  COPD -Does not use home oxygen -Continue home inhalers -Chest x-ray negative  Hypokalemia -Replete  Tobacco use disorder -Continue nicotine patch  Chronic hyponatremia -Sodium stable at 133  Vitamin D deficiency -Vitamin D level 9.67 -Started vitamin D 50,000 units weekly   Medications     acetaminophen  1,000 mg Oral TID   aspirin  81 mg Oral BID   docusate sodium  100 mg Oral BID   fluticasone furoate-vilanterol  1 puff Inhalation Daily   And    umeclidinium bromide  1 puff Inhalation Daily   gabapentin  600 mg Oral TID   Vitamin D (Ergocalciferol)  50,000 Units Oral Q7 days     Data Reviewed:   CBG:  No results for input(s): "GLUCAP" in the last 168 hours.  SpO2: 100 % O2 Flow Rate (L/min): 2 L/min    Vitals:   03/29/23 2050 03/30/23 0455 03/30/23 0849 03/30/23 1336  BP: 123/77 110/65  121/73  Pulse: 86 89  88  Resp: 17 17  18   Temp: 99 F (37.2 C) 99.4 F (37.4 C)  98.7 F (37.1 C)  TempSrc: Oral Oral  Oral  SpO2: 100% 96% 94% 100%  Weight:      Height:          Data Reviewed:  Basic Metabolic Panel: Recent Labs  Lab 03/26/23 1746 03/27/23 0341 03/28/23 0415 03/29/23 0336 03/30/23 0326  NA 131* 132* 128* 136 133*  K 3.2* 4.2 3.8 4.1 3.9  CL 100 101 96* 100 99  CO2 21* 22 25 27 24   GLUCOSE 94 111* 122* 118* 111*  BUN 8 5* 5* 6* <5*  CREATININE 0.38* 0.51 0.44 0.55 0.42*  CALCIUM 8.0* 9.1 8.2* 8.9 8.7*  MG  --  2.0  --   --   --   PHOS  --  4.9*  --   --   --     CBC: Recent Labs  Lab 03/26/23 1746 03/27/23 0341 03/28/23 0415 03/29/23 0336 03/30/23 0326  WBC 12.1* 9.2 9.7 10.9* 10.5  NEUTROABS 8.9* 5.3  --   --   --   HGB 13.7 14.5 11.8* 12.7  11.6*  HCT 39.1 41.5 33.1* 36.3 34.0*  MCV 90.5 90.2 89.9 91.9 91.4  PLT 245 264 185 208 223    LFT Recent Labs  Lab 03/27/23 0341  AST 15  ALT 15  ALKPHOS 129*  BILITOT 0.7  PROT 7.6  ALBUMIN 4.0     Antibiotics: Anti-infectives (From admission, onward)    Start     Dose/Rate Route Frequency Ordered Stop   03/27/23 0630  ceFAZolin (ANCEF) IVPB 2g/100 mL premix        2 g 200 mL/hr over 30 Minutes Intravenous On call to O.R. 03/27/23 0533 03/27/23 1318        DVT prophylaxis: SCDs  Code Status: Full code  Family Communication:    CONSULTS orthopedics   Subjective   Patient seen and examined, no new complaints.   Objective    Physical Examination:   General-appears in no acute distress Heart-S1-S2,  regular, no murmur auscultated Lungs-clear to auscultation bilaterally, no wheezing or crackles auscultated Abdomen-soft, nontender, no organomegaly Extremities-no edema in the lower extremities Neuro-alert, oriented x3, no focal deficit noted  Status is: Inpatient:             Meredeth Ide   [facility] If 7PM-7AM, please contact night-coverage at [email or web address], Office  [phone]   03/30/2023, 4:57 PM  LOS: 4 days

## 2023-03-31 DIAGNOSIS — Z8709 Personal history of other diseases of the respiratory system: Secondary | ICD-10-CM | POA: Diagnosis not present

## 2023-03-31 DIAGNOSIS — S72001D Fracture of unspecified part of neck of right femur, subsequent encounter for closed fracture with routine healing: Secondary | ICD-10-CM | POA: Diagnosis not present

## 2023-03-31 DIAGNOSIS — K7460 Unspecified cirrhosis of liver: Secondary | ICD-10-CM | POA: Diagnosis not present

## 2023-03-31 DIAGNOSIS — E871 Hypo-osmolality and hyponatremia: Secondary | ICD-10-CM | POA: Diagnosis not present

## 2023-03-31 MED ORDER — VITAMIN D (ERGOCALCIFEROL) 1.25 MG (50000 UNIT) PO CAPS: 50000.0000 [IU] | ORAL_CAPSULE | ORAL | 0 refills | Status: DC

## 2023-03-31 MED ORDER — NICOTINE 14 MG/24HR TD PT24: 14.0000 mg | MEDICATED_PATCH | Freq: Every day | TRANSDERMAL | 0 refills | Status: DC | PRN

## 2023-03-31 MED ORDER — DOCUSATE SODIUM 100 MG PO CAPS: 100.0000 mg | ORAL_CAPSULE | Freq: Two times a day (BID) | ORAL | 0 refills | Status: DC

## 2023-03-31 NOTE — Progress Notes (Signed)
Physical Therapy Treatment Patient Details Name: Cassidy Stevens MRN: [id number] DOB: [birth year] Today's Date: 03/31/2023   History of Present Illness Pt s/p fall with R hip fx and now s/p R THR by anterior direct and with hx of Cirrhosis and COPD    PT Comments  Pt is making steady progress, incr activity tolerance and  incr gait distance, demonstrates good stability with RW, no LOB. D/c plan remains appropriate    Assistance Recommended at Discharge Intermittent Supervision/Assistance  If plan is discharge home, recommend the following:  Can travel by private vehicle    A little help with walking and/or transfers;A little help with bathing/dressing/bathroom;Assistance with cooking/housework;Assist for transportation;Help with stairs or ramp for entrance   Yes  Equipment Recommendations  Rolling walker (2 wheels)    Recommendations for Other Services       Precautions / Restrictions Precautions Precautions: Fall Restrictions Weight Bearing Restrictions: No RLE Weight Bearing: Weight bearing as tolerated     Mobility  Bed Mobility Overal bed mobility: Needs Assistance Bed Mobility: Supine to Sit, Sit to Supine     Supine to sit: Modified independent (Device/Increase time) Sit to supine: Modified independent (Device/Increase time)   General bed mobility comments: Increased time; no physical assist - able to self progress without use of gait belt today    Transfers Overall transfer level: Needs assistance Equipment used: Rolling walker (2 wheels) Transfers: Sit to/from Stand Sit to Stand: Supervision           General transfer comment: cues for LE management and use of UEs to self assist;    Ambulation/Gait Ambulation/Gait assistance: Min guard, Supervision Gait Distance (Feet): 100 Feet Assistive device: Rolling walker (2 wheels) Gait Pattern/deviations: Step-to pattern, Decreased step length - right, Decreased step length - left, Decreased stance  time - right       General Gait Details: Initial cues for sequence, posture, stride length and position from RW;  good gait stability, no LOB   Stairs             Wheelchair Mobility     Tilt Bed    Modified Rankin (Stroke Patients Only)       Balance           Standing balance support: No upper extremity supported Standing balance-Leahy Scale: Fair                              Cognition Arousal/Alertness: Awake/alert Behavior During Therapy: WFL for tasks assessed/performed Overall Cognitive Status: Within Functional Limits for tasks assessed                                          Exercises Total Joint Exercises Ankle Circles/Pumps: AROM, Both, 5 reps    General Comments        Pertinent Vitals/Pain Pain Assessment Pain Assessment: Faces Faces Pain Scale: Hurts a little bit Pain Location: R hip Pain Descriptors / Indicators: Aching, Grimacing, Guarding, Sore Pain Intervention(s): Limited activity within patient's tolerance, Monitored during session, Premedicated before session, Repositioned    Home Living                          Prior Function            PT Goals (current goals can now be found in  the care plan section) Acute Rehab PT Goals Patient Stated Goal: REhab to regain IND PT Goal Formulation: With patient Time For Goal Achievement: 04/10/23 Potential to Achieve Goals: Good Progress towards PT goals: Progressing toward goals    Frequency    7X/week      PT Plan Current plan remains appropriate    Co-evaluation              AM-PAC PT "6 Clicks" Mobility   Outcome Measure  Help needed turning from your back to your side while in a flat bed without using bedrails?: A Little Help needed moving from lying on your back to sitting on the side of a flat bed without using bedrails?: A Little Help needed moving to and from a bed to a chair (including a wheelchair)?: A Little Help  needed standing up from a chair using your arms (e.g., wheelchair or bedside chair)?: A Little Help needed to walk in hospital room?: A Little Help needed climbing 3-5 steps with a railing? : A Little 6 Click Score: 18    End of Session Equipment Utilized During Treatment: Gait belt Activity Tolerance: Patient tolerated treatment well Patient left: in bed;with call bell/phone within reach;Other (comment) (pt going to BR I'ly per pt and staff report) Nurse Communication: Mobility status PT Visit Diagnosis: Difficulty in walking, not elsewhere classified (R26.2);Pain Pain - Right/Left: Right Pain - part of body: Hip     Time: 1127-1140 PT Time Calculation (min) (ACUTE ONLY): 13 min  Charges:    $Gait Training: 8-22 mins PT General Charges $$ ACUTE PT VISIT: 1 Visit                     Charlsie Fleeger, PT  Acute Rehab Dept ([facility]) [phone]  03/31/2023    [facility] 03/31/2023, 12:01 PM

## 2023-03-31 NOTE — Discharge Summary (Signed)
Physician Discharge Summary   Patient: Ryleeann Ryerson Leeth MRN: [id number] DOB: October 19, 1960  Admit date:     03/26/2023  Discharge date: 03/31/23  Discharge Physician: Meredeth Ide   PCP: Remus Loffler, PA-C   Recommendations at discharge:   Follow-up Dr. Donnie Coffin in 2 weeks Continue aspirin 81 mg twice daily for 2 weeks then once a day for 2 weeks for DVT prophylaxis Check CBC in 3 days Take vitamin D 50,000 units weekly at least for 3 months, started on 03/27/2023.  Next dose due on 04/03/2023  Discharge Diagnoses: Principal Problem:   Closed right hip fracture [facility]) Active Problems:   Hypokalemia   Fall at home, initial encounter   Leukocytosis   SIRS (systemic inflammatory response syndrome) (HCC)   Cirrhosis (HCC)   History of COPD   Tobacco abuse   Chronic hyponatremia  Resolved Problems:   * No resolved hospital problems. *  Hospital Course:  62 year old female, lives with extended family, ambulates with the help of walker but was also looking for a wheelchair, extensive PMH including COPD, ongoing tobacco use, cirrhosis, chronic hyponatremia, presented to the ED with complaints of right hip pain following ground-level fall at home. Admitted for right femoral neck fracture. Orthopedics consulted and s/p anterior right total hip arthroplasty 6/28 by Dr. Turner Daniels     Assessment and Plan:   Right femoral neck fracture -Sustained after ground-level mechanical fall at home -As per orthopedic consultation, patient originally fractured her right femoral neck about a month ago at which time it was minimally displaced valgus fracture, THA was planned but postponed due to lumbar spine issues, now has further displacement of the right femoral neck fracture that needs surgical intervention. -Treatment of vitamin D deficiency as below, will need bone density scan as outpatient -S/p right anterior total hip arthroplasty on 6/29 -Orthopedic recommend follow-up in office with Dr. Turner Daniels in  10 to 14 days postop, WBAT, aspirin 81 mg p.o. twice daily for 2 weeks for postop DVT prophylaxis -Patient to go to skilled send facility for rehab   Acute posthemorrhagic anemia -EBL 400 mL -Hemoglobin dropped from 14.5 preop to 11.8 postop -Today hemoglobin is 11.6 -Check CBC in 3 days   COPD -Does not use home oxygen -Continue home inhalers -Chest x-ray negative   Hypokalemia -Replete   Tobacco use disorder -Continue nicotine patch   Chronic hyponatremia -Sodium stable at 133   Vitamin D deficiency -Vitamin D level 9.67 -Started vitamin D 50,000 units weekly. -Next dose on 04/03/2023         Consultants: Orthopedics Procedures performed: Right femoral neck fracture Disposition: Home Diet recommendation:  Discharge Diet Orders (From admission, onward)     Start     Ordered   03/31/23 0000  Diet - low sodium heart healthy        03/31/23 1129           Regular diet DISCHARGE MEDICATION: Allergies as of 03/31/2023   No Known Allergies      Medication List     STOP taking these medications    butalbital-acetaminophen-caffeine 50-325-40 MG tablet Commonly known as: FIORICET   HYDROcodone-acetaminophen 5-325 MG tablet Commonly known as: NORCO/VICODIN   ondansetron 4 MG tablet Commonly known as: ZOFRAN       TAKE these medications    acetaminophen 500 MG tablet Commonly known as: TYLENOL Take 1,000 mg by mouth every 6 (six) hours as needed for mild pain or moderate pain.   albuterol 108 (90 Base)  MCG/ACT inhaler Commonly known as: VENTOLIN HFA Inhale 1-2 puffs into the lungs every 6 (six) hours as needed for wheezing.   aspirin 81 MG chewable tablet Chew 1 tablet (81 mg total) by mouth 2 (two) times daily. For 2 weeks then once a day for 2 weeks for DVT prevention.   docusate sodium 100 MG capsule Commonly known as: COLACE Take 1 capsule (100 mg total) by mouth 2 (two) times daily.   gabapentin 600 MG tablet Commonly known as:  NEURONTIN Take 300-600 mg by mouth 3 (three) times daily.   nicotine 14 mg/24hr patch Commonly known as: NICODERM CQ - dosed in mg/24 hours Place 1 patch (14 mg total) onto the skin daily as needed (smoking cessation).   ondansetron 4 MG disintegrating tablet Commonly known as: ZOFRAN-ODT Take 4 mg by mouth every 8 (eight) hours as needed for nausea or vomiting.   oxyCODONE 5 MG immediate release tablet Commonly known as: Oxy IR/ROXICODONE Take 1-2 tablets (5-10 mg total) by mouth every 6 (six) hours as needed for moderate pain or severe pain (post op pain). IMMEDIATE RELEASE - OxyIR   tiZANidine 2 MG tablet Commonly known as: ZANAFLEX Take 1-2 tablets (2-4 mg total) by mouth every 6 (six) hours as needed for muscle spasms. What changed:  how much to take when to take this reasons to take this   Trelegy Ellipta 200-62.5-25 MCG/ACT Aepb Generic drug: Fluticasone-Umeclidin-Vilant Inhale 1 puff into the lungs daily. What changed:  when to take this reasons to take this   Vitamin D (Ergocalciferol) 1.25 MG (50000 UNIT) Caps capsule Commonly known as: DRISDOL Take 1 capsule (50,000 Units total) by mouth every 7 (seven) days. Start taking on: April 03, 2023               Durable Medical Equipment  (From admission, onward)           Start     Ordered   03/27/23 1715  DME Walker rolling  Once       Question:  Patient needs a walker to treat with the following condition  Answer:  Primary osteoarthritis of right hip   03/27/23 1714   03/27/23 1715  DME 3 n 1  Once        03/27/23 1714   03/27/23 1715  DME Bedside commode  Once       Question:  Patient needs a bedside commode to treat with the following condition  Answer:  Primary osteoarthritis of right hip   03/27/23 1714            Follow-up Information     Gean Birchwood, MD. Schedule an appointment as soon as possible for a visit in 2 week(s).   Specialty: Orthopedic Surgery Contact information: [address] [place] [address] [phone]                Discharge Exam: Filed Weights   03/27/23 0412 03/29/23 0500 03/31/23 0500  Weight: 80.3 kg 81.7 kg 79.5 kg   General-appears in no acute distress Heart-S1-S2, regular, no murmur auscultated Lungs-clear to auscultation bilaterally, no wheezing or crackles auscultated Abdomen-soft, nontender, no organomegaly Extremities-no edema in the lower extremities Neuro-alert, oriented x3, no focal deficit noted  Condition at discharge: good  The results of significant diagnostics from this hospitalization (including imaging, microbiology, ancillary and laboratory) are listed below for reference.   Imaging Studies: DG HIP UNILAT WITH PELVIS 1V RIGHT  Result Date: 03/27/2023 CLINICAL DATA:  Right hip replacement  EXAM: DG HIP (WITH OR WITHOUT PELVIS) 1V RIGHT COMPARISON:  03/26/2023 FINDINGS: Eleven fluoroscopic images are obtained during the performance of the procedure and are provided for interpretation only. Images demonstrate right total hip replacement with cerclage wire. Fluoroscopy time: 5 seconds Cumulative air kerma: 0.769 mGy IMPRESSION: Intraoperative fluoroscopic guidance for right total hip replacement. Electronically Signed   By: Wiliam Ke M.D.   On: 03/27/2023 15:39   DG C-Arm 1-60 Min-No Report  Result Date: 03/27/2023 Fluoroscopy was utilized by the requesting physician.  No radiographic interpretation.   DG C-Arm 1-60 Min-No Report  Result Date: 03/27/2023 Fluoroscopy was utilized by the requesting physician.  No radiographic interpretation.   DG Chest Portable 1 View  Result Date: 03/26/2023 CLINICAL DATA:  Preop EXAM: PORTABLE CHEST 1 VIEW COMPARISON:  03/05/2023 FINDINGS: Unchanged cardiac and mediastinal contours given AP technique. No new focal pulmonary opacity. No pleural effusion or pneumothorax. No acute osseous abnormality. IMPRESSION: No active disease. Electronically Signed   By: Wiliam Ke M.D.    On: 03/26/2023 19:43   DG Hip Unilat W or Wo Pelvis 2-3 Views Right  Result Date: 03/26/2023 CLINICAL DATA:  Status post fall. EXAM: DG HIP (WITH OR WITHOUT PELVIS) 2-3V RIGHT COMPARISON:  None Available. FINDINGS: There is an acute fracture deformity extending through the head and neck of the proximal right femur, with approximately 1/2 shaft width dorsal and lateral displacement of the distal fracture site. There is no evidence of dislocation. IMPRESSION: Acute fracture of the proximal right femur. Electronically Signed   By: Aram Candela M.D.   On: 03/26/2023 19:41   DG Chest 2 View  Result Date: 03/12/2023 CLINICAL DATA:  Pre operative chest radiograph for right hip surgery. EXAM: CHEST - 2 VIEW COMPARISON:  None Available. FINDINGS: Bilateral lung fields are clear. Bilateral costophrenic angles are clear. Normal cardio-mediastinal silhouette. No acute osseous abnormalities. The soft tissues are within normal limits. IMPRESSION: No active cardiopulmonary disease. Electronically Signed   By: Jules Schick M.D.   On: 03/12/2023 08:38    Microbiology: Results for orders placed or performed during the hospital encounter of 03/26/23  Surgical PCR screen     Status: None   Collection Time: 03/27/23  5:55 AM   Specimen: Nasal Mucosa; Nasal Swab  Result Value Ref Range Status   MRSA, PCR NEGATIVE NEGATIVE Final   Staphylococcus aureus NEGATIVE NEGATIVE Final    Comment: (NOTE) The Xpert SA Assay (FDA approved for NASAL specimens in patients 85 years of age and older), is one component of a comprehensive surveillance program. It is not intended to diagnose infection nor to guide or monitor treatment. Performed at [facility], [address], [place], [place] [address]     Labs: CBC: Recent Labs  Lab 03/26/23 1746 03/27/23 0341 03/28/23 0415 03/29/23 0336 03/30/23 0326  WBC 12.1* 9.2 9.7 10.9* 10.5  NEUTROABS 8.9* 5.3  --   --   --   HGB 13.7 14.5 11.8* 12.7  11.6*  HCT 39.1 41.5 33.1* 36.3 34.0*  MCV 90.5 90.2 89.9 91.9 91.4  PLT 245 264 185 208 223   Basic Metabolic Panel: Recent Labs  Lab 03/26/23 1746 03/27/23 0341 03/28/23 0415 03/29/23 0336 03/30/23 0326  NA 131* 132* 128* 136 133*  K 3.2* 4.2 3.8 4.1 3.9  CL 100 101 96* 100 99  CO2 21* 22 25 27 24   GLUCOSE 94 111* 122* 118* 111*  BUN 8 5* 5* 6* <5*  CREATININE 0.38* 0.51  0.44 0.55 0.42*  CALCIUM 8.0* 9.1 8.2* 8.9 8.7*  MG  --  2.0  --   --   --   PHOS  --  4.9*  --   --   --    Liver Function Tests: Recent Labs  Lab 03/27/23 0341  AST 15  ALT 15  ALKPHOS 129*  BILITOT 0.7  PROT 7.6  ALBUMIN 4.0   CBG: No results for input(s): "GLUCAP" in the last 168 hours.  Discharge time spent: greater than 30 minutes.  Signed: Meredeth Ide, MD [facility] 03/31/2023

## 2023-03-31 NOTE — Progress Notes (Addendum)
Occupational Therapy Treatment Patient Details Name: Cassidy Stevens Level MRN: [id number] DOB: [birth year] Today's Date: 03/31/2023   History of present illness Pt s/p fall with R hip fx and now s/p R THR by anterior direct and with hx of Cirrhosis and COPD   OT comments  Pt making good progress with functional goals. Pt in shower upon OT arrival with NT present. Pt completed UB bathing with Sup, LB min A, UB dressing with Sup and LB dressing to don socks min A seated. Pt washed hair with Sup. Pt used RW to walk over to bed to sit with Sup. Pt eager to d/c to SNF for rehab.   Recommendations for follow up therapy are one component of a multi-disciplinary discharge planning process, led by the attending physician.  Recommendations may be updated based on patient status, additional functional criteria and insurance authorization.    Assistance Recommended at Discharge Intermittent Supervision/Assistance  Patient can return home with the following  A little help with walking and/or transfers;Assistance with cooking/housework;A little help with bathing/dressing/bathroom;Help with stairs or ramp for entrance   Equipment Recommendations  Other (comment) (TBD at next venue)    Recommendations for Other Services      Precautions / Restrictions Precautions Precautions: Fall Restrictions Weight Bearing Restrictions: No RLE Weight Bearing: Weight bearing as tolerated       Mobility Bed Mobility               General bed mobility comments: pt on shower chair in bathroom    Transfers Overall transfer level: Needs assistance Equipment used: Rolling walker (2 wheels) Transfers: Sit to/from Stand Sit to Stand: Supervision                 Balance Overall balance assessment: Needs assistance Sitting-balance support: No upper extremity supported, Feet supported Sitting balance-Leahy Scale: Good     Standing balance support: No upper extremity supported Standing balance-Leahy  Scale: Fair                             ADL either performed or assessed with clinical judgement   ADL Overall ADL's : Needs assistance/impaired     Grooming: Wash/dry hands;Wash/dry face;Standing;Min guard   Upper Body Bathing: Supervision/ safety   Lower Body Bathing: Minimal assistance   Upper Body Dressing : Supervision/safety;Sitting   Lower Body Dressing: Minimal assistance   Toilet Transfer: Supervision/safety;Rolling walker (2 wheels)   Toileting- Clothing Manipulation and Hygiene: Supervision/safety   Tub/ Shower Transfer: Min guard;Supervision/safety;Rolling walker (2 wheels)   Functional mobility during ADLs: Supervision/safety;Rolling walker (2 wheels)      Extremity/Trunk Assessment Upper Extremity Assessment Upper Extremity Assessment: Overall WFL for tasks assessed   Lower Extremity Assessment Lower Extremity Assessment: Defer to PT evaluation RLE Coordination: WNL        Vision Ability to See in Adequate Light: 0 Adequate Patient Visual Report: No change from baseline     Perception     Praxis      Cognition Arousal/Alertness: Awake/alert Behavior During Therapy: WFL for tasks assessed/performed Overall Cognitive Status: Within Functional Limits for tasks assessed                                          Exercises      Shoulder Instructions       General Comments  Pertinent Vitals/ Pain       Pain Assessment Pain Assessment: 0-10 Pain Score: 2  Pain Location: R hip Pain Descriptors / Indicators: Aching, Sore, Discomfort Pain Intervention(s): Monitored during session, Premedicated before session, Repositioned  Home Living                                          Prior Functioning/Environment              Frequency  Min 1X/week        Progress Toward Goals  OT Goals(current goals can now be found in the care plan section)  Progress towards OT goals: Progressing  toward goals     Plan Discharge plan remains appropriate    Co-evaluation                 AM-PAC OT "6 Clicks" Daily Activity     Outcome Measure   Help from another person eating meals?: None Help from another person taking care of personal grooming?: A Little Help from another person toileting, which includes using toliet, bedpan, or urinal?: A Little Help from another person bathing (including washing, rinsing, drying)?: A Little Help from another person to put on and taking off regular upper body clothing?: A Little Help from another person to put on and taking off regular lower body clothing?: A Little 6 Click Score: 19    End of Session Equipment Utilized During Treatment: Gait belt;Rolling walker (2 wheels)  OT Visit Diagnosis: Pain;Unsteadiness on feet (R26.81);History of falling (Z91.81) Pain - Right/Left: Right Pain - part of body: Hip   Activity Tolerance Patient tolerated treatment well   Patient Left with call bell/phone within reach (sitting EOB)   Nurse Communication          Time: 1101 -1124 OT Time Calculation (min): 28 min  Charges: OT Treatments $Self Care/Home Management : 8-22 mins $Therapeutic Activity: 8-22 mins    Galen Manila 03/31/2023, 1:33 PM

## 2023-03-31 NOTE — TOC Transition Note (Signed)
Transition of Care [facility]) - CM/SW Discharge Note   Patient Details  Name: Cassidy Stevens MRN: [id number] Date of Birth: 28-Aug-1961  [facility] of [facility]) CM/SW Contact:  Coralyn Helling, LCSW Phone Number: 03/31/2023, 1:19 PM   Clinical Narrative:     Patient to dc to [facility] via [facility]. Daughter, Crystal notified of transfer. Packet on patient chart.   RN# for report: [phone]  Final next level of care: Skilled Nursing Facility Barriers to Discharge: No Barriers Identified   Patient Goals and CMS Choice CMS Medicare.gov Compare Post Acute Care list provided to:: Patient Choice offered to / list presented to : Adult Children  Discharge Placement                Patient chooses bed at:  [facility]) Patient to be transferred to facility by: [facility] Name of family member notified: Crystal, Daughter Patient and family notified of of transfer: 03/31/23  Discharge Plan and Services Additional resources added to the After Visit Summary for                  DME Arranged: N/A DME Agency: NA       HH Arranged: NA HH Agency: NA        Social Determinants of Health (SDOH) Interventions SDOH Screenings   Food Insecurity: No Food Insecurity (03/26/2023)  Housing: Low Risk  (03/26/2023)  Transportation Needs: No Transportation Needs (03/26/2023)  Utilities: Not At Risk (03/26/2023)  Depression (PHQ2-9): Low Risk  (03/11/2023)  Tobacco Use: High Risk (03/30/2023)     Readmission Risk Interventions    03/28/2023    2:52 PM  Readmission Risk Prevention Plan  Transportation Screening Complete  PCP or Specialist Appt within 5-7 Days Complete  Home Care Screening Complete  Medication Review (RN CM) Complete

## 2023-04-10 ENCOUNTER — Ambulatory Visit (HOSPITAL_COMMUNITY): Admission: RE | Admit: 2023-04-10 | Payer: Medicaid Other | Source: Ambulatory Visit

## 2023-05-04 ENCOUNTER — Ambulatory Visit (HOSPITAL_COMMUNITY)
Admission: RE | Admit: 2023-05-04 | Discharge: 2023-05-04 | Disposition: A | Discharge status: Home / Self Care | Payer: Medicaid Other | Source: Ambulatory Visit | Attending: Nurse Practitioner | Admitting: Nurse Practitioner

## 2023-05-04 DIAGNOSIS — R1010 Upper abdominal pain, unspecified: Secondary | ICD-10-CM | POA: Diagnosis not present

## 2023-05-04 MED ORDER — GADOBUTROL 1 MMOL/ML IV SOLN
8.0000 mL | Freq: Once | INTRAVENOUS | Status: AC | PRN
  Administered 2023-05-04: 8 mL via INTRAVENOUS

## 2023-06-29 ENCOUNTER — Telehealth: Payer: Self-pay | Admitting: Nurse Practitioner

## 2023-06-29 NOTE — Telephone Encounter (Signed)
Inbound call from patient stating she has been having sharp pains in her abdomen. Patient states she has also been nauseous and constipated. Patient states when she does have bowel movements they are grey and have a lot of mucous. Patient also stated she was advised by her primary care to be seen as soon as possible. Patient requesting a call back to discuss further. Please advise, thank you.

## 2023-06-30 NOTE — Telephone Encounter (Signed)
Pt stated that she has been having an ongoing issue with constipation, nausea, and abdominal pain, BM's associated with mucus. Pt did have recent MRI completed. Pt stated that she has been working with her primary care physician who also notified to schedule an appointment to see her GI Dr. Maurice Small BM yesterday. Pt was scheduled to see Alcide Evener NP on 08/06/2023 at 11:00 AM Pt verbalized understanding with all questions answered.

## 2023-07-06 ENCOUNTER — Ambulatory Visit: Payer: Medicaid Other | Admitting: Nurse Practitioner

## 2023-07-06 ENCOUNTER — Other Ambulatory Visit (INDEPENDENT_AMBULATORY_CARE_PROVIDER_SITE_OTHER): Payer: Medicaid Other

## 2023-07-06 ENCOUNTER — Encounter: Payer: Self-pay | Admitting: Nurse Practitioner

## 2023-07-06 VITALS — BP 126/64 | HR 87 | Ht 64.0 in | Wt 169.5 lb

## 2023-07-06 DIAGNOSIS — R1013 Epigastric pain: Secondary | ICD-10-CM

## 2023-07-06 DIAGNOSIS — K5900 Constipation, unspecified: Secondary | ICD-10-CM

## 2023-07-06 DIAGNOSIS — B182 Chronic viral hepatitis C: Secondary | ICD-10-CM

## 2023-07-06 LAB — CBC WITH DIFFERENTIAL/PLATELET
Basophils Absolute: 0.1 10*3/uL (ref 0.0–0.1)
Basophils Relative: 0.7 % (ref 0.0–3.0)
Eosinophils Absolute: 0 10*3/uL (ref 0.0–0.7)
Eosinophils Relative: 0.1 % (ref 0.0–5.0)
HCT: 45.1 % (ref 36.0–46.0)
Hemoglobin: 15.3 g/dL — ABNORMAL HIGH (ref 12.0–15.0)
Lymphocytes Relative: 31.2 % (ref 12.0–46.0)
Lymphs Abs: 2.5 10*3/uL (ref 0.7–4.0)
MCHC: 34 g/dL (ref 30.0–36.0)
MCV: 90.7 fL (ref 78.0–100.0)
Monocytes Absolute: 0.6 10*3/uL (ref 0.1–1.0)
Monocytes Relative: 7.4 % (ref 3.0–12.0)
Neutro Abs: 4.9 10*3/uL (ref 1.4–7.7)
Neutrophils Relative %: 60.6 % (ref 43.0–77.0)
Platelets: 240 10*3/uL (ref 150.0–400.0)
RBC: 4.97 Mil/uL (ref 3.87–5.11)
RDW: 13.3 % (ref 11.5–15.5)
WBC: 8 10*3/uL (ref 4.0–10.5)

## 2023-07-06 LAB — COMPREHENSIVE METABOLIC PANEL
ALT: 10 U/L (ref 0–35)
AST: 13 U/L (ref 0–37)
Albumin: 4.2 g/dL (ref 3.5–5.2)
Alkaline Phosphatase: 117 U/L (ref 39–117)
BUN: 4 mg/dL — ABNORMAL LOW (ref 6–23)
CO2: 26 meq/L (ref 19–32)
Calcium: 9 mg/dL (ref 8.4–10.5)
Chloride: 98 meq/L (ref 96–112)
Creatinine, Ser: 0.5 mg/dL (ref 0.40–1.20)
GFR: 100.5 mL/min (ref >60.00)
Glucose, Bld: 96 mg/dL (ref 70–99)
Potassium: 3.9 meq/L (ref 3.5–5.1)
Sodium: 133 meq/L — ABNORMAL LOW (ref 135–145)
Total Bilirubin: 0.5 mg/dL (ref 0.2–1.2)
Total Protein: 7.2 g/dL (ref 6.0–8.3)

## 2023-07-06 LAB — LIPASE: Lipase: 707 U/L — ABNORMAL HIGH (ref 11.0–59.0)

## 2023-07-06 MED ORDER — FAMOTIDINE 20 MG PO TABS: 20.0000 mg | ORAL_TABLET | Freq: Every day | ORAL | 1 refills | Status: DC
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
## 2023-07-06 NOTE — Patient Instructions (Addendum)
Your provider has requested that you go to the basement level for lab work before leaving today. Press "B" on the elevator. The lab is located at the first door on the left as you exit the elevator.  We have sent the following medications to your pharmacy for you to pick up at your convenience: Famotidine 20 mg  Contact our office if your constipation or abdominal pain persists or worsens.  Due to recent changes in healthcare laws, you may see the results of your imaging and laboratory studies on MyChart before your provider has had a chance to review them.  We understand that in some cases there may be results that are confusing or concerning to you. Not all laboratory results come back in the same time frame and the provider may be waiting for multiple results in order to interpret others.  Please give [place] 48 hours in order for your provider to thoroughly review all the results before contacting the office for clarification of your results.   Thank you for trusting me with your gastrointestinal care!   Alcide Evener, CRNP

## 2023-07-08 ENCOUNTER — Other Ambulatory Visit: Payer: Self-pay

## 2023-07-08 ENCOUNTER — Telehealth: Payer: Self-pay | Admitting: Nurse Practitioner

## 2023-07-08 DIAGNOSIS — R109 Unspecified abdominal pain: Secondary | ICD-10-CM

## 2023-07-08 DIAGNOSIS — R748 Abnormal levels of other serum enzymes: Secondary | ICD-10-CM

## 2023-07-08 DIAGNOSIS — B182 Chronic viral hepatitis C: Secondary | ICD-10-CM

## 2023-07-08 DIAGNOSIS — R1013 Epigastric pain: Secondary | ICD-10-CM

## 2023-07-08 NOTE — Telephone Encounter (Signed)
Left message for pt to call back  °

## 2023-07-08 NOTE — Telephone Encounter (Signed)
Inbound call from patient, states she is still having constipation issues and would like to discuss different medications for further plan of care. Please advise.

## 2023-07-09 ENCOUNTER — Other Ambulatory Visit: Payer: Self-pay

## 2023-07-09 DIAGNOSIS — R109 Unspecified abdominal pain: Secondary | ICD-10-CM

## 2023-07-09 DIAGNOSIS — R748 Abnormal levels of other serum enzymes: Secondary | ICD-10-CM

## 2023-07-09 LAB — AFP TUMOR MARKER: AFP-Tumor Marker: 18 ng/mL — ABNORMAL HIGH

## 2023-07-09 LAB — HEPATITIS C RNA QUANTITATIVE
HCV Quantitative Log: <1.18 {Log}
HCV RNA, PCR, QN: <15 [IU]/mL

## 2023-07-10 NOTE — Telephone Encounter (Signed)
Refer to lab result 07/06/23.

## 2023-07-16 ENCOUNTER — Telehealth: Payer: Self-pay

## 2023-07-16 NOTE — Telephone Encounter (Signed)
Reminder received in Epic. Unable to reach pt nor leave voice mail. Pt phone rings continuously

## 2023-07-16 NOTE — Telephone Encounter (Signed)
-----   Message from Nurse Kerrie Buffalo sent at 07/09/2023 10:04 AM EDT ----- Regarding: labs Pt needs repeat labs, orders in epic.

## 2023-07-17 ENCOUNTER — Other Ambulatory Visit (INDEPENDENT_AMBULATORY_CARE_PROVIDER_SITE_OTHER): Payer: Medicaid Other

## 2023-07-17 DIAGNOSIS — R109 Unspecified abdominal pain: Secondary | ICD-10-CM | POA: Diagnosis not present

## 2023-07-17 DIAGNOSIS — R748 Abnormal levels of other serum enzymes: Secondary | ICD-10-CM | POA: Diagnosis not present

## 2023-07-17 LAB — LIPASE: Lipase: 12 U/L (ref 11.0–59.0)

## 2023-07-17 LAB — HEPATIC FUNCTION PANEL
ALT: 11 U/L (ref 0–35)
AST: 13 U/L (ref 0–37)
Albumin: 4.2 g/dL (ref 3.5–5.2)
Alkaline Phosphatase: 122 U/L — ABNORMAL HIGH (ref 39–117)
Bilirubin, Direct: 0.1 mg/dL (ref 0.0–0.3)
Total Bilirubin: 0.4 mg/dL (ref 0.2–1.2)
Total Protein: 7.2 g/dL (ref 6.0–8.3)

## 2023-07-17 NOTE — Telephone Encounter (Signed)
Patient is active on mychart. Sent lab reminder to patient.

## 2023-07-17 NOTE — Telephone Encounter (Signed)
Patient completed lab work

## 2023-07-22 NOTE — Progress Notes (Signed)
Cassidy Stevens, pls change colonoscopy recall to Jan 2026 as noted per Dr. Frankey Shown addendum note. THX.

## 2023-07-22 NOTE — Progress Notes (Signed)
Agree with the assessment and plan as outlined by Alcide Evener, NP.   Previous colonoscopy report and path results reviewed. Can modify to 2 year recall based on 9-10 adenomas. To continue f/u in the [facility] for HCV (s/p SVR now) and cirrhosis).    Doristine Locks, DO, [facility]

## 2023-07-23 ENCOUNTER — Telehealth: Payer: Self-pay

## 2023-07-23 NOTE — Telephone Encounter (Signed)
Author: Arnaldo Natal, NP Service: Gastroenterology Author Type: Nurse Practitioner  Filed: 07/22/2023  2:01 PM Encounter Date: 07/06/2023 Status: Signed  Editor: Arnaldo Natal, NP (Nurse Practitioner)   Viviann Spare, pls change colonoscopy recall to Jan 2026 as noted per Dr. Frankey Shown addendum note. THX.    Recall updated.

## 2023-08-17 ENCOUNTER — Ambulatory Visit
Admission: RE | Admit: 2023-08-17 | Discharge: 2023-08-17 | Disposition: A | Discharge status: Home / Self Care | Payer: Medicaid Other | Source: Ambulatory Visit | Attending: Nurse Practitioner

## 2023-08-17 DIAGNOSIS — R109 Unspecified abdominal pain: Secondary | ICD-10-CM

## 2023-08-17 DIAGNOSIS — R748 Abnormal levels of other serum enzymes: Secondary | ICD-10-CM

## 2023-08-17 MED ORDER — GADOPICLENOL 0.5 MMOL/ML IV SOLN
9.0000 mL | Freq: Once | INTRAVENOUS | Status: AC | PRN
  Administered 2023-08-17: 9 mL via INTRAVENOUS

## 2023-08-24 ENCOUNTER — Other Ambulatory Visit: Payer: Self-pay | Admitting: Orthopedic Surgery

## 2023-08-31 ENCOUNTER — Other Ambulatory Visit (HOSPITAL_COMMUNITY): Payer: Medicaid Other
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
## 2023-08-31 NOTE — Patient Instructions (Signed)
SURGICAL WAITING ROOM VISITATION Patients having surgery or a procedure may have no more than 2 support people in the waiting area - these visitors may rotate in the visitor waiting room.   Due to an increase in RSV and influenza rates and associated hospitalizations, children ages 51 and under may not visit patients in [facility] hospitals. If the patient needs to stay at the hospital during part of their recovery, the visitor guidelines for inpatient rooms apply.  PRE-OP VISITATION  Pre-op nurse will coordinate an appropriate time for 1 support person to accompany the patient in pre-op.  This support person may not rotate.  This visitor will be contacted when the time is appropriate for the visitor to come back in the pre-op area.  Please refer to the [facility] website for the visitor guidelines for Inpatients (after your surgery is over and you are in a regular room).  You are not required to quarantine at this time prior to your surgery. However, you must do this: Hand Hygiene often Do NOT share personal items Notify your provider if you are in close contact with someone who has COVID or you develop fever 100.4 or greater, new onset of sneezing, cough, sore throat, shortness of breath or body aches.  If you test positive for Covid or have been in contact with anyone that has tested positive in the last 10 days please notify you surgeon.    Your procedure is scheduled on:  Monday   September 07, 2023  Report to [facility] Main Entrance: Leota [facility]sen entrance where the [employer] is available.   Report to admitting at: 05:15    AM  Call this number if you have any questions or problems the morning of surgery [phone]  Do not eat food after Midnight the night prior to your surgery/procedure.  After Midnight you may have the following liquids until 04:30 AM  DAY OF SURGERY  Clear Liquid Diet Water Black Coffee (sugar ok, NO MILK/CREAM OR CREAMERS)  Tea (sugar ok, NO  MILK/CREAM OR CREAMERS) regular and decaf                             Plain Jell-O  with no fruit (NO RED)                                           Fruit ices (not with fruit pulp, NO RED)                                     Popsicles (NO RED)                                                                  Juice: NO CITRUS JUICES: only apple, WHITE grape, WHITE cranberry Sports drinks like Gatorade or Powerade (NO RED)                   The day of surgery:  Drink ONE (1) Pre-Surgery Clear Ensure at   04:30 AM the morning of  surgery. Drink in one sitting. Do not sip.  This drink was given to you during your hospital pre-op appointment visit. Nothing else to drink after completing the Pre-Surgery Clear Ensure  : No candy, chewing gum or throat lozenges.    FOLLOW ANY ADDITIONAL PRE OP INSTRUCTIONS YOU RECEIVED FROM YOUR SURGEON'S OFFICE!!!   Oral Hygiene is also important to reduce your risk of infection.        Remember - BRUSH YOUR TEETH THE MORNING OF SURGERY WITH YOUR REGULAR TOOTHPASTE  Do NOT smoke after Midnight the night before surgery.  STOP TAKING all Vitamins, Herbs and supplements 1 week before your surgery.   Take ONLY these medicines the morning of surgery with A SIP OF WATER: Omeprazole, gabapentin,  You may take EITHER Tramadol OR Hydrocodone- APAP if needed for severe pain.  You may use your Flonase nasal spray and your Albuterol / Trelegy Ellipta inhalers if needed.                     You may not have any metal on your body including hair pins, jewelry, and body piercing  Do not wear make-up, lotions, powders, perfumes  or deodorant  Do not wear nail polish including gel and S&S, artificial / acrylic nails, or any other type of covering on natural nails including finger and toenails. If you have artificial nails, gel coating, etc., that needs to be removed by a nail salon, Please have this removed prior to surgery. Not doing so may mean that your surgery could be  cancelled or delayed if the Surgeon or anesthesia staff feels like they are unable to monitor you safely.   Do not shave 48 hours prior to surgery to avoid nicks in your skin which may contribute to postoperative infections.    Contacts, Hearing Aids, dentures or bridgework may not be worn into surgery. DENTURES WILL BE REMOVED PRIOR TO SURGERY PLEASE DO NOT APPLY "Poly grip" OR ADHESIVES!!!  Patients discharged on the day of surgery will not be allowed to drive home.  Someone NEEDS to stay with you for the first 24 hours after anesthesia.  Do not bring your home medications to the hospital. The Pharmacy will dispense medications listed on your medication list to you during your admission in the Hospital.  Please read over the following fact sheets you were given: IF YOU HAVE QUESTIONS ABOUT YOUR PRE-OP INSTRUCTIONS, PLEASE CALL [phone].     Pre-operative 5 CHG Bath Instructions   You can play a key role in reducing the risk of infection after surgery. Your skin needs to be as free of germs as possible. You can reduce the number of germs on your skin by washing with CHG (chlorhexidine gluconate) soap before surgery. CHG is an antiseptic soap that kills germs and continues to kill germs even after washing.   DO NOT use if you have an allergy to chlorhexidine/CHG or antibacterial soaps. If your skin becomes reddened or irritated, stop using the CHG and notify one of our RNs at [phone]  Please shower with the CHG soap starting 4 days before surgery using the following schedule: START SHOWERS ON   THURSDAY  September 03, 2023  Please keep in mind the following:  DO NOT shave, including legs and underarms, starting the day of your first shower.   You may shave your face at any point before/day of surgery.    Place clean sheets on your bed the day you start using CHG soap. Use a clean washcloth (not used since being washed) for each shower. DO NOT sleep with pets once you start using the CHG.   CHG Shower Instructions:  If you choose to wash your hair and private area, wash first with your normal shampoo/soap.  After you use shampoo/soap, rinse your hair and body thoroughly to remove shampoo/soap residue.  Turn the water OFF and apply about 3 tablespoons (45 ml) of CHG soap to a CLEAN washcloth.  Apply CHG soap ONLY FROM YOUR NECK DOWN TO YOUR TOES (washing for 3-5 minutes)  DO NOT use CHG soap on face, private areas, open wounds, or sores.  Pay special attention to the area where your surgery is being performed.  If you are having back surgery, having someone wash your back for you may be helpful.  Wait 2 minutes after CHG soap is applied, then you may rinse off the CHG soap.  Pat dry with a clean towel  Put on clean clothes/pajamas   If you choose to wear lotion, please use ONLY the CHG-compatible lotions on the back of this paper.     Additional instructions for the day of surgery: DO NOT APPLY any lotions, deodorants, cologne, or perfumes.   Put on clean/comfortable clothes.  Brush your teeth.  Ask your nurse before applying any prescription medications to the skin.      CHG Compatible Lotions   Aveeno Moisturizing lotion  Cetaphil Moisturizing Cream  Cetaphil Moisturizing Lotion  Clairol Herbal Essence Moisturizing Lotion, Dry Skin  Clairol Herbal Essence Moisturizing Lotion, Extra Dry Skin  Clairol Herbal Essence Moisturizing Lotion, Normal Skin  Curel Age Defying Therapeutic Moisturizing Lotion with Alpha Hydroxy  Curel Extreme Care Body Lotion  Curel Soothing Hands Moisturizing Hand Lotion  Curel Therapeutic Moisturizing Cream, Fragrance-Free  Curel Therapeutic Moisturizing Lotion, Fragrance-Free  Curel Therapeutic Moisturizing Lotion, Original Formula  Eucerin Daily  Replenishing Lotion  Eucerin Dry Skin Therapy Plus Alpha Hydroxy Crme  Eucerin Dry Skin Therapy Plus Alpha Hydroxy Lotion  Eucerin Original Crme  Eucerin Original Lotion  Eucerin Plus Crme Eucerin Plus Lotion  Eucerin TriLipid Replenishing Lotion  Keri Anti-Bacterial Hand Lotion  Keri Deep Conditioning Original Lotion Dry Skin Formula Softly Scented  Keri Deep Conditioning Original Lotion, Fragrance Free Sensitive Skin Formula  Keri Lotion Fast Absorbing Fragrance Free Sensitive Skin Formula  Keri Lotion Fast Absorbing Softly Scented Dry Skin Formula  Keri Original Lotion  Keri Skin Renewal Lotion Keri Silky Smooth Lotion  Keri Silky Smooth Sensitive Skin Lotion  Nivea Body Creamy Conditioning Oil  Nivea Body Extra Enriched Lotion  Nivea Body Original Lotion  Nivea Body Sheer Moisturizing Lotion Nivea Crme  Nivea Skin Firming Lotion  NutraDerm 30 Skin Lotion  NutraDerm Skin Lotion  NutraDerm Therapeutic Skin Cream  NutraDerm Therapeutic Skin Lotion  ProShield Protective Hand Cream  Provon moisturizing lotion   FAILURE TO FOLLOW THESE INSTRUCTIONS MAY RESULT IN THE CANCELLATION OF YOUR SURGERY  PATIENT SIGNATURE_________________________________  NURSE SIGNATURE__________________________________  ________________________________________________________________________     Cassidy Stevens    An incentive spirometer is a tool that can help keep your lungs clear and active. This tool measures how well you are filling your lungs with each breath. Taking long deep  breaths may help reverse or decrease the chance of developing breathing (pulmonary) problems (especially infection) following: A long period of time when you are unable to move or be active. BEFORE THE PROCEDURE  If the spirometer includes an indicator to show your best effort, your nurse or respiratory therapist will set it to a desired goal. If possible, sit up straight or lean slightly forward. Try not to  slouch. Hold the incentive spirometer in an upright position. INSTRUCTIONS FOR USE  Sit on the edge of your bed if possible, or sit up as far as you can in bed or on a chair. Hold the incentive spirometer in an upright position. Breathe out normally. Place the mouthpiece in your mouth and seal your lips tightly around it. Breathe in slowly and as deeply as possible, raising the piston or the ball toward the top of the column. Hold your breath for 3-5 seconds or for as long as possible. Allow the piston or ball to fall to the bottom of the column. Remove the mouthpiece from your mouth and breathe out normally. Rest for a few seconds and repeat Steps 1 through 7 at least 10 times every 1-2 hours when you are awake. Take your time and take a few normal breaths between deep breaths. The spirometer may include an indicator to show your best effort. Use the indicator as a goal to work toward during each repetition. After each set of 10 deep breaths, practice coughing to be sure your lungs are clear. If you have an incision (the cut made at the time of surgery), support your incision when coughing by placing a pillow or rolled up towels firmly against it. Once you are able to get out of bed, walk around indoors and cough well. You may stop using the incentive spirometer when instructed by your caregiver.  RISKS AND COMPLICATIONS Take your time so you do not get dizzy or light-headed. If you are in pain, you may need to take or ask for pain medication before doing incentive spirometry. It is harder to take a deep breath if you are having pain. AFTER USE Rest and breathe slowly and easily. It can be helpful to keep track of a log of your progress. Your caregiver can provide you with a simple table to help with this. If you are using the spirometer at home, follow these instructions: SEEK MEDICAL CARE IF:  You are having difficultly using the spirometer. You have trouble using the spirometer as often as  instructed. Your pain medication is not giving enough relief while using the spirometer. You develop fever of 100.5 F (38.1 C) or higher.                                                                                                    SEEK IMMEDIATE MEDICAL CARE IF:  You cough up bloody sputum that had not been present before. You develop fever of 102 F (38.9 C) or greater. You develop worsening pain at or near the incision site. MAKE SURE YOU:  Understand these instructions. Will watch your condition. Will get  help right away if you are not doing well or get worse. Document Released: 01/26/2007 Document Revised: 12/08/2011 Document Reviewed: 03/29/2007 [facility] Patient Information 2014 [place], [place].

## 2023-09-01 ENCOUNTER — Other Ambulatory Visit: Payer: Self-pay

## 2023-09-01 ENCOUNTER — Encounter (HOSPITAL_COMMUNITY): Payer: Self-pay

## 2023-09-01 ENCOUNTER — Encounter (HOSPITAL_COMMUNITY)
Admission: RE | Admit: 2023-09-01 | Discharge: 2023-09-01 | Disposition: A | Discharge status: Home / Self Care | Payer: Medicaid Other | Source: Ambulatory Visit | Attending: Orthopedic Surgery | Admitting: Orthopedic Surgery

## 2023-09-01 VITALS — BP 128/88 | HR 84 | Temp 98.4°F | Resp 18 | Ht 62.5 in | Wt 168.0 lb

## 2023-09-01 DIAGNOSIS — K7460 Unspecified cirrhosis of liver: Secondary | ICD-10-CM | POA: Diagnosis not present

## 2023-09-01 DIAGNOSIS — Z01812 Encounter for preprocedural laboratory examination: Secondary | ICD-10-CM | POA: Insufficient documentation

## 2023-09-01 DIAGNOSIS — Z01818 Encounter for other preprocedural examination: Secondary | ICD-10-CM

## 2023-09-01 DIAGNOSIS — F1911 Other psychoactive substance abuse, in remission: Secondary | ICD-10-CM | POA: Diagnosis not present

## 2023-09-01 DIAGNOSIS — Z8619 Personal history of other infectious and parasitic diseases: Secondary | ICD-10-CM | POA: Diagnosis not present

## 2023-09-01 DIAGNOSIS — E871 Hypo-osmolality and hyponatremia: Secondary | ICD-10-CM | POA: Diagnosis not present

## 2023-09-01 DIAGNOSIS — F17210 Nicotine dependence, cigarettes, uncomplicated: Secondary | ICD-10-CM | POA: Insufficient documentation

## 2023-09-01 DIAGNOSIS — I2510 Atherosclerotic heart disease of native coronary artery without angina pectoris: Secondary | ICD-10-CM | POA: Diagnosis not present

## 2023-09-01 DIAGNOSIS — M797 Fibromyalgia: Secondary | ICD-10-CM | POA: Diagnosis not present

## 2023-09-01 DIAGNOSIS — J449 Chronic obstructive pulmonary disease, unspecified: Secondary | ICD-10-CM | POA: Insufficient documentation

## 2023-09-01 HISTORY — DX: Anxiety disorder, unspecified: F41.9

## 2023-09-01 HISTORY — DX: Gastro-esophageal reflux disease without esophagitis: K21.9

## 2023-09-01 HISTORY — DX: Other psychoactive substance use, unspecified, uncomplicated: F19.90

## 2023-09-01 HISTORY — DX: Depression, unspecified: F32.A

## 2023-09-01 LAB — COMPREHENSIVE METABOLIC PANEL
ALT: 13 U/L (ref 0–44)
AST: 15 U/L (ref 15–41)
Albumin: 4.1 g/dL (ref 3.5–5.0)
Alkaline Phosphatase: 94 U/L (ref 38–126)
Anion gap: 9 (ref 5–15)
BUN: 6 mg/dL — ABNORMAL LOW (ref 8–23)
CO2: 22 mmol/L (ref 22–32)
Calcium: 8.8 mg/dL — ABNORMAL LOW (ref 8.9–10.3)
Chloride: 102 mmol/L (ref 98–111)
Creatinine, Ser: 0.36 mg/dL — ABNORMAL LOW (ref 0.44–1.00)
GFR, Estimated: >60 mL/min (ref >60)
Glucose, Bld: 103 mg/dL — ABNORMAL HIGH (ref 70–99)
Potassium: 4.1 mmol/L (ref 3.5–5.1)
Sodium: 133 mmol/L — ABNORMAL LOW (ref 135–145)
Total Bilirubin: 0.8 mg/dL (ref <1.2)
Total Protein: 7.3 g/dL (ref 6.5–8.1)

## 2023-09-01 LAB — SURGICAL PCR SCREEN
MRSA, PCR: NEGATIVE
Staphylococcus aureus: NEGATIVE

## 2023-09-01 LAB — CBC
HCT: 41.7 % (ref 36.0–46.0)
Hemoglobin: 14.7 g/dL (ref 12.0–15.0)
MCH: 31.5 pg (ref 26.0–34.0)
MCHC: 35.3 g/dL (ref 30.0–36.0)
MCV: 89.3 fL (ref 80.0–100.0)
Platelets: 226 10*3/uL (ref 150–400)
RBC: 4.67 MIL/uL (ref 3.87–5.11)
RDW: 13.9 % (ref 11.5–15.5)
WBC: 6.8 10*3/uL (ref 4.0–10.5)
nRBC: 0 % (ref 0.0–0.2)

## 2023-09-02 ENCOUNTER — Encounter (HOSPITAL_COMMUNITY): Payer: Self-pay | Admitting: Physician Assistant

## 2023-09-02 ENCOUNTER — Encounter (HOSPITAL_COMMUNITY): Payer: Self-pay

## 2023-09-02 NOTE — Progress Notes (Signed)
DISCUSSION: Cassidy Stevens is a 62 yo female who presents to PAT prior to R TKA on 09/07/23. PMH of every day smoking, ?CAD, ?MI, COPD, migraines, hx of IVDU (in remission), GERD, Hep C, cirrhosis, chronic hyponatremia, fibromyalgia.   Patient had a fall and R hip fx and underwent uncomplicated THA on 03/27/23.  Reportedly patient had an MI in [place] years ago in 2012. Was followed by Cardiology there. Unclear history. Patient currently asymptomatic. Has not established care with Cardiology here. Unclear if she needs ongoing Cardiology f/u. Records requested from clinic were she was last seen in 2021. Also discussed case with Dr. Chaney Malling. As this is an elective case she will need to be rescheduled until she has cardiac risk assessment. Message left with Darel Hong at Dr. Luiz Blare office  Patient has seen Pulmonology in the past but has been lost to follow up. Last seen 11/08/2021 by Dr. Judeth Horn. Has chronic cough/SOB due to COPD and ongoing smoking. Uses inhalers.   Follows with GI and transplant hepatology. She is s/p tx for Hep C by ID. Abdominal MRI in 04/2023 showing early cirrhosis. No evidence of hepatic decompensation. No varices on EGD in January 2024. She has no thrombocytopenia.    VS: BP 128/88 Comment: right arm sitting  Pulse 84   Temp 36.9 C (Oral)   Resp 18   Ht 5' 2.5" (1.588 m)   Wt 76.2 kg   SpO2 99%   BMI 30.24 kg/m   PROVIDERS: PCP -  Aniceto Boss, PA-C Cardiologist -  none here,   seen in [facility] in [place], [place]. doesn't know name Pulmonology- Vilma Meckel, MD Infectious Disease- Danelle Earthly, MD  Liver Clinic- Annamarie Major, NP at [facility]   LABS: Labs reviewed: Acceptable for surgery. (all labs ordered are listed, but only abnormal results are displayed)  Labs Reviewed  COMPREHENSIVE METABOLIC PANEL - Abnormal; Notable for the following components:      Result Value   Sodium 133 (*)    Glucose, Bld 103 (*)    BUN 6 (*)    Creatinine, Ser 0.36 (*)     Calcium 8.8 (*)    All other components within normal limits  SURGICAL PCR SCREEN  CBC     IMAGES: MRI Abdomen 08/17/23:   IMPRESSION: 1. No MRI findings of the abdomen to explain elevated lipase. No pancreatic ductal dilatation or surrounding inflammatory changes. 2. Contracted gallbladder containing layering sludge. No discrete gallstones, gallbladder wall thickening, or biliary dilatation. 3. Mildly coarse contour of the liver, suggestive of cirrhosis.  EKG 03/27/23:  ** Suspect arm lead reversal, interpretation assumes no reversal Normal sinus rhythm Lateral infarct , age undetermined Inferior infarct , age undetermined Abnormal ECG When compared with ECG of 05-Mar-2023 11:26, PREVIOUS ECG IS PRESENT Limb lead reversal  CV:  Past Medical History:  Diagnosis Date   Anxiety    Arthritis    Cancer (HCC)    melnoma on nose   Cirrhosis (HCC)    Liver Cirrhosis   COPD (chronic obstructive pulmonary disease) (HCC)    Depression    Fibromyalgia    GERD (gastroesophageal reflux disease)    Headache    Hepatitis C    IVDU (intravenous drug user)    Remote history   Myocardial infarction (HCC) 2012   seen in [place]   Osteoporosis    Substance abuse (HCC) 2009   Hx ETOH    Past Surgical History:  Procedure Laterality Date   ABDOMINAL HYSTERECTOMY  FACIAL RECONSTRUCTION SURGERY     KNEE SURGERY Right    meniscus repair   TOTAL HIP ARTHROPLASTY Right 03/27/2023   Procedure: TOTAL HIP ARTHROPLASTY ANTERIOR APPROACH;  Surgeon: Gean Birchwood, MD;  Location: [facility];  Service: Orthopedics;  Laterality: Right;    MEDICATIONS:  albuterol (VENTOLIN HFA) 108 (90 Base) MCG/ACT inhaler   butalbital-acetaminophen-caffeine (FIORICET) 50-325-40 MG tablet   ciclopirox (PENLAC) 8 % solution   famotidine (PEPCID) 20 MG tablet   fluticasone (FLONASE) 50 MCG/ACT nasal spray   Fluticasone-Umeclidin-Vilant (TRELEGY ELLIPTA) 200-62.5-25 MCG/INH AEPB   gabapentin (NEURONTIN)  600 MG tablet   HYDROcodone-acetaminophen (NORCO/VICODIN) 5-325 MG tablet   MELATONIN PO   omeprazole (PRILOSEC) 20 MG capsule   ondansetron (ZOFRAN-ODT) 4 MG disintegrating tablet   raloxifene (EVISTA) 60 MG tablet   traMADol (ULTRAM) 50 MG tablet   No current facility-administered medications for this encounter.   Marcille Blanco [facility] Surgical Short Stay/Anesthesiology [facility] Phone [phone] 09/04/2023 9:14 AM

## 2023-09-03 ENCOUNTER — Telehealth: Payer: Self-pay

## 2023-09-03 NOTE — Telephone Encounter (Signed)
-----   Message from [place] sent at 09/03/2023  1:16 PM EST ----- Ladoris Gene, thanks for checking, no need to do another hep C RNA at this time. Thx. ----- Message ----- From: Lucky Cowboy, RN Sent: 09/03/2023  10:34 AM EST To: Arnaldo Natal, NP  Hilbert Odor, this reminder was set for patient to have repeat Hep C lab for this month. Looks like she had one done in October. Please advise if you would still like for patient to have repeat. Thank you! ----- Message ----- From: Lucky Cowboy, RN Sent: 08/31/2023   8:00 AM EST To: Emeline Darling, RN; Lucky Cowboy, RN  "Viviann Spare, pls enter Hep C RNA quant recall 08/2023."  Refer to imaging result note 05/04/23.

## 2023-09-07 ENCOUNTER — Ambulatory Visit (HOSPITAL_COMMUNITY): Admission: RE | Admit: 2023-09-07 | Payer: Medicaid Other | Source: Home / Self Care | Admitting: Orthopedic Surgery

## 2023-09-07 ENCOUNTER — Encounter (HOSPITAL_COMMUNITY): Admission: RE | Payer: Self-pay | Source: Home / Self Care

## 2023-09-07 SURGERY — ARTHROPLASTY, KNEE, TOTAL
Anesthesia: Spinal | Site: Knee | Laterality: Right

## 2023-09-28 ENCOUNTER — Encounter: Payer: Self-pay | Admitting: *Deleted

## 2023-10-06 ENCOUNTER — Encounter: Payer: Self-pay | Admitting: Cardiovascular Disease

## 2023-10-06 ENCOUNTER — Ambulatory Visit: Payer: Medicaid Other | Attending: Cardiovascular Disease | Admitting: Cardiovascular Disease

## 2023-10-06 VITALS — BP 126/80 | HR 84 | Ht 64.0 in | Wt 177.4 lb

## 2023-10-06 DIAGNOSIS — Z01810 Encounter for preprocedural cardiovascular examination: Secondary | ICD-10-CM

## 2023-10-06 DIAGNOSIS — Z01818 Encounter for other preprocedural examination: Secondary | ICD-10-CM | POA: Diagnosis not present

## 2023-10-06 DIAGNOSIS — R0602 Shortness of breath: Secondary | ICD-10-CM

## 2023-10-06 DIAGNOSIS — Z720 Tobacco use: Secondary | ICD-10-CM | POA: Diagnosis not present

## 2023-10-06 NOTE — Patient Instructions (Signed)
 Medication Instructions:  No changes *If you need a refill on your cardiac medications before your next appointment, please call your pharmacy*   Lab Work: None ordered If you have labs (blood work) drawn today and your tests are completely normal, you will receive your results only by: MyChart Message (if you have MyChart) OR A paper copy in the mail If you have any lab test that is abnormal or we need to change your treatment, we will call you to review the results.   Testing/Procedures: Your physician has requested that you have an echocardiogram. Echocardiography is a painless test that uses sound waves to create images of your heart. It provides your doctor with information about the size and shape of your heart and how well your heart's chambers and valves are working. You may receive an ultrasound enhancing agent through an IV if needed to better visualize your heart during the echo.This procedure takes approximately one hour. There are no restrictions for this procedure. This will take place at the [address], [address].   Please note: We ask at that you not bring children with you during ultrasound (echo/ vascular) testing. Due to room size and safety concerns, children are not allowed in the ultrasound rooms during exams. Our front office staff cannot provide observation of children in our lobby area while testing is being conducted. An adult accompanying a patient to their appointment will only be allowed in the ultrasound room at the discretion of the ultrasound technician under special circumstances. We apologize for any inconvenience.    Follow-Up: At [facility], you and your health needs are our priority.  As part of our continuing mission to provide you with exceptional heart care, we have created designated Provider Care Teams.  These Care Teams include your primary Cardiologist (physician) and Advanced Practice Providers (APPs -  Physician Assistants and Nurse  Practitioners) who all work together to provide you with the care you need, when you need it.  We recommend signing up for the patient portal called MyChart.  Sign up information is provided on this After Visit Summary.  MyChart is used to connect with patients for Virtual Visits (Telemedicine).  Patients are able to view lab/test results, encounter notes, upcoming appointments, etc.  Non-urgent messages can be sent to your provider as well.   To learn more about what you can do with MyChart, go to [email or web address].    Your next appointment:   Follow up as needed

## 2023-10-06 NOTE — Progress Notes (Signed)
 Cardiology Office Note   Date:  10/06/2023   ID:  Cassidy Stevens, DOB [birth year], MRN [id number]  PCP:  Joshua Clayborne RAMAN, PA-C  Cardiologist:   Deatrice Cage, MD   Chief Complaint  Patient presents with   New Patient (Initial Visit)    Cardiac clearance no complaints today. Meds reviewed verbally with pt.      History of Present Illness: Cassidy Stevens is a 63 y.o. female who was referred by Jon Joshua for preop cardiovascular evaluation before knee replacement. She has past medical history of COPD, arthritis, migraine, excessive alcohol use, fibromyalgia, PTSD, treated hepatitis C, ?liver cirrhosis, chronic back pain, GERD and osteoporosis.  She lived in [place]  before and was told about an abnormal EKG showing prior infarct.  In addition, she reported atypical chest pain She had a previous Lexiscan Myoview in March 2021 in [place]  which showed no evidence of ischemia with normal ejection fraction.  She denies chest pain at the present time but she is limited by dyspnea with underlying COPD.  In addition, she is severely limited by arthritis pain and not able to walk much because of her knee discomfort.  She is on hydrocodone for that. She is a long-term smoker and has been trying to cut down.  She smokes 5 cigarettes/day now.  She quit alcohol drinking 13 years ago.  Her father had CABG in his 38s.  She did undergo hip surgery in June without issues.   Past Medical History:  Diagnosis Date   Anxiety    Arthritis    Cancer (HCC)    melnoma on nose   Cirrhosis (HCC)    Liver Cirrhosis   COPD (chronic obstructive pulmonary disease) (HCC)    Depression    Fibromyalgia    GERD (gastroesophageal reflux disease)    Headache    Hepatitis C    IVDU (intravenous drug user)    Remote history   Myocardial infarction (HCC) 2012   seen in [place]    Osteoporosis    Substance abuse (HCC) 2009   Hx ETOH    Past Surgical History:  Procedure Laterality Date    ABDOMINAL HYSTERECTOMY     FACIAL RECONSTRUCTION SURGERY     KNEE SURGERY Right    meniscus repair   TOTAL HIP ARTHROPLASTY Right 03/27/2023   Procedure: TOTAL HIP ARTHROPLASTY ANTERIOR APPROACH;  Surgeon: Liam Lerner, MD;  Location: [facility];  Service: Orthopedics;  Laterality: Right;     Current Outpatient Medications  Medication Sig Dispense Refill   albuterol  (VENTOLIN  HFA) 108 (90 Base) MCG/ACT inhaler Inhale 1-2 puffs into the lungs every 6 (six) hours as needed for wheezing.     butalbital -acetaminophen -caffeine  (FIORICET ) 50-325-40 MG tablet Take 1 tablet by mouth every 6 (six) hours as needed for migraine.     ciclopirox (PENLAC) 8 % solution Apply 1 application  topically at bedtime.     famotidine  (PEPCID ) 20 MG tablet Take 1 tablet (20 mg total) by mouth daily. 30 tablet 1   fluticasone  (FLONASE) 50 MCG/ACT nasal spray Place 2 sprays into both nostrils daily.     Fluticasone -Umeclidin-Vilant (TRELEGY ELLIPTA ) 200-62.5-25 MCG/INH AEPB Inhale 1 puff into the lungs daily. 60 each 11   gabapentin  (NEURONTIN ) 600 MG tablet Take 300-600 mg by mouth 3 (three) times daily.     HYDROcodone-acetaminophen  (NORCO/VICODIN) 5-325 MG tablet Take 1 tablet by mouth every 8 (eight) hours as needed for moderate pain (pain score 4-6).     MELATONIN PO Take 1  tablet by mouth at bedtime.     omeprazole (PRILOSEC) 20 MG capsule Take 20 mg by mouth daily.     ondansetron  (ZOFRAN -ODT) 4 MG disintegrating tablet Take 4 mg by mouth every 8 (eight) hours as needed for nausea or vomiting.     raloxifene (EVISTA) 60 MG tablet Take 60 mg by mouth daily.     traMADol  (ULTRAM ) 50 MG tablet Take 50 mg by mouth daily as needed for moderate pain (pain score 4-6).     No current facility-administered medications for this visit.    Allergies:   Patient has no known allergies.    Social History:  The patient  reports that she has been smoking cigarettes. She has never used smokeless tobacco. She reports that she  does not currently use alcohol. She reports that she does not currently use drugs.   Family History:  The patient's family history includes Heart disease in her father and sister; Neuropathy in her maternal aunt and maternal uncle.    ROS:  Please see the history of present illness.   Otherwise, review of systems are positive for none.   All other systems are reviewed and negative.    PHYSICAL EXAM: VS:  BP 126/80 (BP Location: Right Arm, Cuff Size: Normal)   Pulse 84   Ht 5' 4 (1.626 m)   Wt 177 lb 6 oz (80.5 kg)   SpO2 97%   BMI 30.45 kg/m  , BMI Body mass index is 30.45 kg/m. GEN: Well nourished, well developed, in no acute distress  HEENT: normal  Neck: no JVD, carotid bruits, or masses Cardiac: RRR; no murmurs, rubs, or gallops,no edema  Respiratory:  clear to auscultation bilaterally, normal work of breathing GI: soft, nontender, nondistended, + BS MS: no deformity or atrophy  Skin: warm and dry, no rash Neuro:  Strength and sensation are intact Psych: euthymic mood, full affect   EKG:  EKG is ordered today. The ekg ordered today demonstrates : Normal sinus rhythm Low voltage QRS Inferior infarct (cited on or before 27-Mar-2023) When compared with ECG of 27-Mar-2023 05:58, QRS axis Shifted right Criteria for Lateral infarct are no longer Present    Recent Labs: 03/27/2023: Magnesium 2.0 09/01/2023: ALT 13; BUN 6; Creatinine, Ser 0.36; Hemoglobin 14.7; Platelets 226; Potassium 4.1; Sodium 133    Lipid Panel No results found for: CHOL, TRIG, HDL, CHOLHDL, VLDL, LDLCALC, LDLDIRECT    Wt Readings from Last 3 Encounters:  10/06/23 177 lb 6 oz (80.5 kg)  09/01/23 168 lb (76.2 kg)  07/06/23 169 lb 8 oz (76.9 kg)          10/06/2023    3:23 PM  PAD Screen  Previous PAD dx? No  Previous surgical procedure? No  Pain with walking? No  Feet/toe relief with dangling? No  Painful, non-healing ulcers? No  Extremities discolored? No       ASSESSMENT AND PLAN:  1.  Preop cardiovascular evaluation for knee replacement: No previous issues with hip surgery in June.  Her EKG is abnormal with possible old inferior infarct.  However, previous nuclear stress test in 2021 showed no evidence of perfusion defect with normal ejection fraction.  Her current symptoms include shortness of breath which could be due to COPD.  No chest pain.  Her functional capacity is severely limited by knee arthritis. I am going to obtain an echocardiogram to ensure normal ejection fraction and wall motion.  No need for a stress test.  If echocardiogram is unremarkable, she  can proceed with surgery at an overall low risk from a cardiac standpoint.  2.  Tobacco use: Discussed the importance of cessation.    Disposition:   FU as needed.   Signed,  Deatrice Cage, MD  10/06/2023 3:48 PM    [facility]

## 2023-10-07 ENCOUNTER — Ambulatory Visit (HOSPITAL_COMMUNITY)
Admission: RE | Admit: 2023-10-07 | Discharge: 2023-10-07 | Disposition: A | Discharge status: Home / Self Care | Payer: Medicaid Other | Source: Ambulatory Visit | Attending: Cardiovascular Disease | Admitting: Cardiovascular Disease

## 2023-10-07 DIAGNOSIS — R0602 Shortness of breath: Secondary | ICD-10-CM | POA: Diagnosis not present

## 2023-10-07 DIAGNOSIS — Z01818 Encounter for other preprocedural examination: Secondary | ICD-10-CM | POA: Diagnosis not present

## 2023-10-07 DIAGNOSIS — Z01810 Encounter for preprocedural cardiovascular examination: Secondary | ICD-10-CM | POA: Diagnosis not present

## 2023-10-07 DIAGNOSIS — F17200 Nicotine dependence, unspecified, uncomplicated: Secondary | ICD-10-CM | POA: Diagnosis not present

## 2023-10-07 DIAGNOSIS — J449 Chronic obstructive pulmonary disease, unspecified: Secondary | ICD-10-CM | POA: Insufficient documentation

## 2023-10-07 DIAGNOSIS — R0600 Dyspnea, unspecified: Secondary | ICD-10-CM | POA: Insufficient documentation

## 2023-10-07 LAB — ECHOCARDIOGRAM COMPLETE
AR max vel: 2.79 cm2
AV Area VTI: 2.83 cm2
AV Area mean vel: 2.7 cm2
AV Mean grad: 3 mm[Hg]
AV Peak grad: 5.5 mm[Hg]
Ao pk vel: 1.17 m/s
Area-P 1/2: 4.71 cm2
Calc EF: 64.3 %
MV VTI: 3.17 cm2
S' Lateral: 2.5 cm
Single Plane A2C EF: 66.2 %
Single Plane A4C EF: 58.5 %

## 2023-10-07 NOTE — Progress Notes (Signed)
*  PRELIMINARY RESULTS* Echocardiogram 2D Echocardiogram has been performed.  Carolyne Fiscal 10/07/2023, 3:59 PM

## 2023-10-12 ENCOUNTER — Telehealth: Payer: Self-pay | Admitting: Cardiovascular Disease

## 2023-10-12 NOTE — Telephone Encounter (Signed)
   Pre-operative Risk Assessment    Patient Name: Cassidy Stevens  DOB: Jan 29, 1961 MRN: [id number]      Request for Surgical Clearance    Procedure:   Rt knee arthoplasty  Date of Surgery:  Clearance TBD                                 Surgeon:  Dr Yvone Surgeon's Group or Practice Name:  [facility] Phone number:  [phone] Fax number:  [phone]   Type of Clearance Requested:   - Medical and pharmacy but unclear on medications   Type of Anesthesia:  Spinal   Additional requests/questions:   none  Signed, April L Harrington   10/12/2023, 4:48 PM

## 2023-10-14 NOTE — Telephone Encounter (Signed)
   Patient Name: Cassidy Stevens  DOB: 1961/01/16 MRN: [id number]  Primary Cardiologist: Alvenia Aus   Chart reviewed as part of pre-operative protocol coverage. Given past medical history and time since last visit, based on ACC/AHA guidelines, Ulyssa Alline Brosseau is at acceptable risk for the planned procedure without further cardiovascular testing.   Per Dr. Alvenia Aus Preop cardiovascular evaluation for knee replacement: No previous issues with hip surgery in June. Her EKG is abnormal with possible old inferior infarct. However, previous nuclear stress test in 2021 showed no evidence of perfusion defect with normal ejection fraction. Her current symptoms include shortness of breath which could be due to COPD. No chest pain. Her functional capacity is severely limited by knee arthritis.  The patient was advised that if she develops new symptoms prior to surgery to contact our office to arrange for a follow-up visit, and she verbalized understanding.Echo completed and was normal on 10/07/2023, echo was fine. Normal ejection fraction and wall motion.  No evidence of prior infarcts.  She is at low risk for knee surgery.   I will route this recommendation to the requesting party via Epic fax function and remove from pre-op pool.  Please call with questions.  Friddie Jetty, NP 10/14/2023, 7:38 AM

## 2023-11-03 ENCOUNTER — Other Ambulatory Visit: Payer: Self-pay | Admitting: Orthopedic Surgery

## 2023-11-05 NOTE — Progress Notes (Signed)
 Sent message, via epic in basket, requesting orders in epic from Careers adviser.

## 2023-11-06 NOTE — Patient Instructions (Addendum)
 SURGICAL WAITING ROOM VISITATION  Patients having surgery or a procedure may have no more than 2 support people in the waiting area - these visitors may rotate.    Children under the age of 58 must have an adult with them who is not the patient.  Due to an increase in RSV and influenza rates and associated hospitalizations, children ages 11 and under may not visit patients in [facility] hospitals.  Visitors with respiratory illnesses are discouraged from visiting and should remain at home.  If the patient needs to stay at the hospital during part of their recovery, the visitor guidelines for inpatient rooms apply. Pre-op nurse will coordinate an appropriate time for 1 support person to accompany patient in pre-op.  This support person may not rotate.    Please refer to the [facility] website for the visitor guidelines for Inpatients (after your surgery is over and you are in a regular room).    Your procedure is scheduled on: 11/16/23   Report to [facility] Main Entrance    Report to admitting at 8:10 AM   Call this number if you have problems the morning of surgery [phone]   Do not eat food :After Midnight.   After Midnight you may have the following liquids until 7:40 AM DAY OF SURGERY  Water  Non-Citrus Juices (without pulp, NO RED-Apple, White grape, White cranberry) Black Coffee (NO MILK/CREAM OR CREAMERS, sugar ok)  Clear Tea (NO MILK/CREAM OR CREAMERS, sugar ok) regular and decaf                             Plain Jell-O (NO RED)                                           Fruit ices (not with fruit pulp, NO RED)                                     Popsicles (NO RED)                                                               Sports drinks like Gatorade (NO RED)                      If you have questions, please contact your surgeon's office.   FOLLOW BOWEL PREP AND ANY ADDITIONAL PRE OP INSTRUCTIONS YOU RECEIVED FROM YOUR SURGEON'S OFFICE!!!     Oral  Hygiene is also important to reduce your risk of infection.                                    Remember - BRUSH YOUR TEETH THE MORNING OF SURGERY WITH YOUR REGULAR TOOTHPASTE  DENTURES WILL BE REMOVED PRIOR TO SURGERY PLEASE DO NOT APPLY "Poly grip" OR ADHESIVES!!!   Do NOT smoke after Midnight   Stop all vitamins and herbal supplements 7 days before surgery.   Take these medicines the morning of  surgery with A SIP OF WATER : Inhalers, Zyrtec, Gabapentin , Norco, Zofran                               You may not have any metal on your body including hair pins, jewelry, and body piercing             Do not wear make-up, lotions, powders, perfumes, or deodorant  Do not wear nail polish including gel and S&S, artificial/acrylic nails, or any other type of covering on natural nails including finger and toenails. If you have artificial nails, gel coating, etc. that needs to be removed by a nail salon please have this removed prior to surgery or surgery may need to be canceled/ delayed if the surgeon/ anesthesia feels like they are unable to be safely monitored.   Do not shave  48 hours prior to surgery.    Do not bring valuables to the hospital. [facility] IS NOT             RESPONSIBLE   FOR VALUABLES.   Contacts, glasses, dentures or bridgework may not be worn into surgery.   Bring small overnight bag day of surgery.   DO NOT BRING YOUR HOME MEDICATIONS TO THE HOSPITAL. PHARMACY WILL DISPENSE MEDICATIONS LISTED ON YOUR MEDICATION LIST TO YOU DURING YOUR ADMISSION IN THE HOSPITAL!              Please read over the following fact sheets you were given: IF YOU HAVE QUESTIONS ABOUT YOUR PRE-OP INSTRUCTIONS PLEASE CALL [name]   If you received a COVID test during your pre-op visit  it is requested that you wear a mask when out in public, stay away from anyone that may not be feeling well and notify your surgeon if you develop symptoms. If you test positive for Covid or have been in  contact with anyone that has tested positive in the last 10 days please notify you surgeon.      Pre-operative 5 CHG Bath Instructions   You can play a key role in reducing the risk of infection after surgery. Your skin needs to be as free of germs as possible. You can reduce the number of germs on your skin by washing with CHG (chlorhexidine  gluconate) soap before surgery. CHG is an antiseptic soap that kills germs and continues to kill germs even after washing.   DO NOT use if you have an allergy to chlorhexidine /CHG or antibacterial soaps. If your skin becomes reddened or irritated, stop using the CHG and notify one of our RNs at [phone].   Please shower with the CHG soap starting 4 days before surgery using the following schedule:     Please keep in mind the following:  DO NOT shave, including legs and underarms, starting the day of your first shower.   You may shave your face at any point before/day of surgery.  Place clean sheets on your bed the day you start using CHG soap. Use a clean washcloth (not used since being washed) for each shower. DO NOT sleep with pets once you start using the CHG.   CHG Shower Instructions:  If you choose to wash your hair and private area, wash first with your normal shampoo/soap.  After you use shampoo/soap, rinse your hair and body thoroughly to remove shampoo/soap residue.  Turn the water  OFF and apply about 3 tablespoons (45 ml) of CHG soap to a CLEAN washcloth.  Apply  CHG soap ONLY FROM YOUR NECK DOWN TO YOUR TOES (washing for 3-5 minutes)  DO NOT use CHG soap on face, private areas, open wounds, or sores.  Pay special attention to the area where your surgery is being performed.  If you are having back surgery, having someone wash your back for you may be helpful. Wait 2 minutes after CHG soap is applied, then you may rinse off the CHG soap.  Pat dry with a clean towel  Put on clean clothes/pajamas   If you choose to wear lotion, please  use ONLY the CHG-compatible lotions on the back of this paper.     Additional instructions for the day of surgery: DO NOT APPLY any lotions, deodorants, cologne, or perfumes.   Put on clean/comfortable clothes.  Brush your teeth.  Ask your nurse before applying any prescription medications to the skin.      CHG Compatible Lotions   Aveeno Moisturizing lotion  Cetaphil Moisturizing Cream  Cetaphil Moisturizing Lotion  Clairol Herbal Essence Moisturizing Lotion, Dry Skin  Clairol Herbal Essence Moisturizing Lotion, Extra Dry Skin  Clairol Herbal Essence Moisturizing Lotion, Normal Skin  Curel Age Defying Therapeutic Moisturizing Lotion with Alpha Hydroxy  Curel Extreme Care Body Lotion  Curel Soothing Hands Moisturizing Hand Lotion  Curel Therapeutic Moisturizing Cream, Fragrance-Free  Curel Therapeutic Moisturizing Lotion, Fragrance-Free  Curel Therapeutic Moisturizing Lotion, Original Formula  Eucerin Daily Replenishing Lotion  Eucerin Dry Skin Therapy Plus Alpha Hydroxy Crme  Eucerin Dry Skin Therapy Plus Alpha Hydroxy Lotion  Eucerin Original Crme  Eucerin Original Lotion  Eucerin Plus Crme Eucerin Plus Lotion  Eucerin TriLipid Replenishing Lotion  Keri Anti-Bacterial Hand Lotion  Keri Deep Conditioning Original Lotion Dry Skin Formula Softly Scented  Keri Deep Conditioning Original Lotion, Fragrance Free Sensitive Skin Formula  Keri Lotion Fast Absorbing Fragrance Free Sensitive Skin Formula  Keri Lotion Fast Absorbing Softly Scented Dry Skin Formula  Keri Original Lotion  Keri Skin Renewal Lotion Keri Silky Smooth Lotion  Keri Silky Smooth Sensitive Skin Lotion  Nivea Body Creamy Conditioning Oil  Nivea Body Extra Enriched Teacher, adult education Moisturizing Lotion Nivea Crme  Nivea Skin Firming Lotion  NutraDerm 30 Skin Lotion  NutraDerm Skin Lotion  NutraDerm Therapeutic Skin Cream  NutraDerm Therapeutic Skin Lotion  ProShield  Protective Hand Cream  Provon moisturizing lotion

## 2023-11-06 NOTE — Progress Notes (Addendum)
COVID Vaccine Completed: no  Date of COVID positive in last 90 days: no  PCP - Prudy Feeler, PA Cardiologist - Lorine Bears, MD  Pulmonologist- Vilma Meckel, MD  Cardiac clearance 10/12/23 by Joni Reining, NP in Epic   Chest x-ray - 03/26/23 Epic EKG - 10/06/23 Epic Stress Test - yes in [facility] ECHO - 10/07/23 Epic Cardiac Cath - n/a Pacemaker/ICD device last checked: n/a Spinal Cord Stimulator: n/a  Bowel Prep - no  Sleep Study - n/a CPAP -   Fasting Blood Sugar - n/a Checks Blood Sugar _____ times a day  Last dose of GLP1 agonist-  N/A GLP1 instructions:  Hold 7 days before surgery    Last dose of SGLT-2 inhibitors-  N/A SGLT-2 instructions:  Hold 3 days before surgery    Blood Thinner Instructions: n/a Aspirin Instructions: Last Dose:  Activity level: Can go up a flight of stairs and perform activities of daily living without stopping and without symptoms of chest pain. Difficulty with stairs due to knee. SOB occasionally, has COPD, not new or changing per pt  Anesthesia review: MI, hep C, Na 129  Patient denies shortness of breath, fever, cough and chest pain at PAT appointment  Patient verbalized understanding of instructions that were given to them at the PAT appointment. Patient was also instructed that they will need to review over the PAT instructions again at home before surgery.

## 2023-11-06 NOTE — Progress Notes (Signed)
 Please place orders for PAT appointment scheduled 11/09/23.

## 2023-11-09 ENCOUNTER — Other Ambulatory Visit: Payer: Self-pay

## 2023-11-09 ENCOUNTER — Encounter (HOSPITAL_COMMUNITY)
Admission: RE | Admit: 2023-11-09 | Discharge: 2023-11-09 | Disposition: A | Discharge status: Home / Self Care | Payer: Medicaid Other | Source: Ambulatory Visit | Attending: Orthopedic Surgery

## 2023-11-09 ENCOUNTER — Encounter (HOSPITAL_COMMUNITY): Payer: Self-pay

## 2023-11-09 ENCOUNTER — Telehealth: Payer: Self-pay

## 2023-11-09 VITALS — BP 130/98 | HR 89 | Temp 98.0°F | Resp 16 | Ht 62.0 in | Wt 164.0 lb

## 2023-11-09 DIAGNOSIS — J449 Chronic obstructive pulmonary disease, unspecified: Secondary | ICD-10-CM | POA: Diagnosis not present

## 2023-11-09 DIAGNOSIS — B1920 Unspecified viral hepatitis C without hepatic coma: Secondary | ICD-10-CM | POA: Insufficient documentation

## 2023-11-09 DIAGNOSIS — F17200 Nicotine dependence, unspecified, uncomplicated: Secondary | ICD-10-CM | POA: Insufficient documentation

## 2023-11-09 DIAGNOSIS — K7460 Unspecified cirrhosis of liver: Secondary | ICD-10-CM | POA: Insufficient documentation

## 2023-11-09 DIAGNOSIS — M1711 Unilateral primary osteoarthritis, right knee: Secondary | ICD-10-CM | POA: Insufficient documentation

## 2023-11-09 DIAGNOSIS — Z01818 Encounter for other preprocedural examination: Secondary | ICD-10-CM

## 2023-11-09 DIAGNOSIS — I2510 Atherosclerotic heart disease of native coronary artery without angina pectoris: Secondary | ICD-10-CM | POA: Insufficient documentation

## 2023-11-09 DIAGNOSIS — Z01812 Encounter for preprocedural laboratory examination: Secondary | ICD-10-CM | POA: Insufficient documentation

## 2023-11-09 LAB — COMPREHENSIVE METABOLIC PANEL
ALT: 13 U/L (ref 0–44)
AST: 16 U/L (ref 15–41)
Albumin: 3.8 g/dL (ref 3.5–5.0)
Alkaline Phosphatase: 81 U/L (ref 38–126)
Anion gap: 11 (ref 5–15)
BUN: 6 mg/dL — ABNORMAL LOW (ref 8–23)
CO2: 20 mmol/L — ABNORMAL LOW (ref 22–32)
Calcium: 8.3 mg/dL — ABNORMAL LOW (ref 8.9–10.3)
Chloride: 98 mmol/L (ref 98–111)
Creatinine, Ser: 0.47 mg/dL (ref 0.44–1.00)
GFR, Estimated: >60 mL/min (ref >60)
Glucose, Bld: 102 mg/dL — ABNORMAL HIGH (ref 70–99)
Potassium: 4.6 mmol/L (ref 3.5–5.1)
Sodium: 129 mmol/L — ABNORMAL LOW (ref 135–145)
Total Bilirubin: 0.7 mg/dL (ref 0.0–1.2)
Total Protein: 7.2 g/dL (ref 6.5–8.1)

## 2023-11-09 LAB — CBC
HCT: 43.3 % (ref 36.0–46.0)
Hemoglobin: 14.8 g/dL (ref 12.0–15.0)
MCH: 30.7 pg (ref 26.0–34.0)
MCHC: 34.2 g/dL (ref 30.0–36.0)
MCV: 89.8 fL (ref 80.0–100.0)
Platelets: 293 10*3/uL (ref 150–400)
RBC: 4.82 MIL/uL (ref 3.87–5.11)
RDW: 13.6 % (ref 11.5–15.5)
WBC: 8.5 10*3/uL (ref 4.0–10.5)
nRBC: 0 % (ref 0.0–0.2)

## 2023-11-09 LAB — SURGICAL PCR SCREEN
MRSA, PCR: NEGATIVE
Staphylococcus aureus: NEGATIVE

## 2023-11-09 NOTE — Telephone Encounter (Signed)
 Reminder received in Epic.  Chart reviewed and noted pt had MRI/ MRCP on 08/17/2023 Recommendations are as follows:  MR ABDOMEN MRCP W WO CONTAST: Result Notes   Lanis Pitcher, RN 09/04/2023  1:03 PM EST     Seen by patient Cassidy Stevens on 09/02/2023  6:59 PM Seen by proxy Ethridge Herder on 09/02/2023  8:18 PM   Staff reminder sent to myself & Landon Pinion, RN to order & schedule RUQ due 01/2025.   Cristobal Donning, RN 09/04/2023  8:24 AM EST     Left message for pt to call back   Tory Freiberg, NP 09/02/2023  6:57 PM EST     Patient was seen by Duey Ghent NP at [facility] on 07/14/2023. MRI showed a normal pancreas. Liver mildly coarse contour consistent with cirrhosis.   Landon Pinion, please enter surveillance RUQ sono due 01/2025. THX.   Dr. Doraine Gallon, refer to office visit 07/06/2023.

## 2023-11-09 NOTE — Telephone Encounter (Signed)
-----   Message from Nurse Archer Kobs C sent at 05/07/2023 12:48 PM EDT ----- Repeat imaging in 6 months. Refer to imaging note 05/04/23.

## 2023-11-10 NOTE — Anesthesia Preprocedure Evaluation (Addendum)
 Anesthesia Evaluation  Patient identified by MRN, date of birth, ID band Patient awake    Reviewed: Allergy & Precautions, NPO status , Patient's Chart, lab work & pertinent test results  Airway Mallampati: I  TM Distance: <3 FB Neck ROM: Full    Dental  (+) Edentulous Upper, Edentulous Lower   Pulmonary COPD,  COPD inhaler, Current Smoker    + decreased breath sounds      Cardiovascular + Past MI   Rhythm:Regular Rate:Normal     Neuro/Psych  Headaches PSYCHIATRIC DISORDERS Anxiety Depression     Neuromuscular disease    GI/Hepatic ,GERD  ,,(+) Cirrhosis       , Hepatitis -, C  Endo/Other  negative endocrine ROS    Renal/GU negative Renal ROS     Musculoskeletal  (+) Arthritis ,  Fibromyalgia -  Abdominal   Peds  Hematology negative hematology ROS (+)   Anesthesia Other Findings   Reproductive/Obstetrics                             Anesthesia Physical Anesthesia Plan  ASA: 3  Anesthesia Plan: Spinal   Post-op Pain Management: Regional block*   Induction: Intravenous  PONV Risk Score and Plan: 2 and Ondansetron, Propofol infusion, Dexamethasone and Midazolam  Airway Management Planned: Natural Airway and Nasal Cannula  Additional Equipment: None  Intra-op Plan:   Post-operative Plan:   Informed Consent: I have reviewed the patients History and Physical, chart, labs and discussed the procedure including the risks, benefits and alternatives for the proposed anesthesia with the patient or authorized representative who has indicated his/her understanding and acceptance.       Plan Discussed with: CRNA  Anesthesia Plan Comments: (See PAT note 11/09/2023  Lab Results      Component                Value               Date                      WBC                      8.5                 11/09/2023                HGB                      14.8                11/09/2023                 HCT                      43.3                11/09/2023                MCV                      89.8                11/09/2023                PLT  293                 11/09/2023           )       Anesthesia Quick Evaluation

## 2023-11-10 NOTE — Progress Notes (Signed)
Anesthesia Chart Review   Case: [id number] Date/Time: 11/16/23 1025   Procedure: TOTAL KNEE ARTHROPLASTY (Right: Knee)   Anesthesia type: Spinal   Pre-op diagnosis: RIGHT KNEE SEVERE DEGENERATIVE JOINT DISEASE   Location: [facility] / [facility]   Surgeons: Jodi Geralds, MD       DISCUSSION:62 y.o. smoker with h/o COPD, Hepatitis C, CAD 2012, right knee djd scheduled for above procedure 11/16/2023 with Dr. Jodi Geralds.   Patient had a fall and R hip fx and underwent uncomplicated THA on 03/27/23.   Follows with GI and transplant hepatology. She is s/p tx for Hep C by ID. Abdominal MRI in 04/2023 showing early cirrhosis. No evidence of hepatic decompensation. No varices on EGD in January 2024. She has no thrombocytopenia.   Per cardiology preoperative evaluation 10/14/2023, "Chart reviewed as part of pre-operative protocol coverage. Given past medical history and time since last visit, based on ACC/AHA guidelines, Cassidy Stevens is at acceptable risk for the planned procedure without further cardiovascular testing.    Per Dr. Kirke Corin Preop cardiovascular evaluation for knee replacement: No previous issues with hip surgery in June. Her EKG is abnormal with possible old inferior infarct. However, previous nuclear stress test in 2021 showed no evidence of perfusion defect with normal ejection fraction. Her current symptoms include shortness of breath which could be due to COPD. No chest pain. Her functional capacity is severely limited by knee arthritis.  The patient was advised that if she develops new symptoms prior to surgery to contact our office to arrange for a follow-up visit, and she verbalized understanding.Echo completed and was normal on 10/07/2023, echo was fine. Normal ejection fraction and wall motion.  No evidence of prior infarcts.  She is at low risk for knee surgery. "   VS: BP (!) 130/98   Pulse 89   Temp 36.7 C (Oral)   Resp 16   Ht 5\' 2"  (1.575 m)   Wt 74.4 kg   SpO2 99%    BMI 30.00 kg/m   PROVIDERS: Remus Loffler, PA-C is PCP   Cardiologist - Lorine Bears, MD   Pulmonologist- Vilma Meckel, MD LABS: Labs reviewed: Acceptable for surgery. (all labs ordered are listed, but only abnormal results are displayed)  Labs Reviewed  COMPREHENSIVE METABOLIC PANEL - Abnormal; Notable for the following components:      Result Value   Sodium 129 (*)    CO2 20 (*)    Glucose, Bld 102 (*)    BUN 6 (*)    Calcium 8.3 (*)    All other components within normal limits  SURGICAL PCR SCREEN  CBC     IMAGES:   EKG:   CV: Echo 10/07/2023 1. Left ventricular ejection fraction, by estimation, is 60 to 65%. The  left ventricle has normal function. The left ventricle has no regional  wall motion abnormalities. Left ventricular diastolic parameters are  consistent with Grade I diastolic  dysfunction (impaired relaxation). The average left ventricular global  longitudinal strain is -15.7 %. The global longitudinal strain is  abnormal.   2. Right ventricular systolic function is normal. The right ventricular  size is normal. There is normal pulmonary artery systolic pressure. The  estimated right ventricular systolic pressure is 17.6 mmHg.   3. The mitral valve is normal in structure. No evidence of mitral valve  regurgitation. No evidence of mitral stenosis.   4. The aortic valve is tricuspid. Aortic valve regurgitation is not  visualized. No aortic stenosis is  present.   5. The inferior vena cava is normal in size with greater than 50%  respiratory variability, suggesting right atrial pressure of 3 mmHg.  Past Medical History:  Diagnosis Date   Anxiety    Arthritis    Cancer (HCC)    melnoma on nose   Cirrhosis (HCC)    Liver Cirrhosis   COPD (chronic obstructive pulmonary disease) (HCC)    Depression    Fibromyalgia    GERD (gastroesophageal reflux disease)    Headache    Hepatitis C    IVDU (intravenous drug user)    Remote history    Myocardial infarction (HCC) 2012   seen in [place]   Osteoporosis    Substance abuse (HCC) 2009   Hx ETOH    Past Surgical History:  Procedure Laterality Date   ABDOMINAL HYSTERECTOMY     FACIAL RECONSTRUCTION SURGERY     KNEE SURGERY Right    meniscus repair   TOTAL HIP ARTHROPLASTY Right 03/27/2023   Procedure: TOTAL HIP ARTHROPLASTY ANTERIOR APPROACH;  Surgeon: Gean Birchwood, MD;  Location: [facility];  Service: Orthopedics;  Laterality: Right;    MEDICATIONS:  albuterol (VENTOLIN HFA) 108 (90 Base) MCG/ACT inhaler   cetirizine (ZYRTEC) 10 MG tablet   Fluticasone-Umeclidin-Vilant (TRELEGY ELLIPTA) 200-62.5-25 MCG/INH AEPB   gabapentin (NEURONTIN) 600 MG tablet   HYDROcodone-acetaminophen (NORCO/VICODIN) 5-325 MG tablet   ondansetron (ZOFRAN-ODT) 4 MG disintegrating tablet   PROLIA 60 MG/ML SOSY injection   Vitamin D, Ergocalciferol, (DRISDOL) 1.25 MG (50000 UNIT) CAPS capsule   No current facility-administered medications for this encounter.    Jodell Cipro Ward, PA-C [facility] Pre-Surgical Testing [phone]

## 2023-11-10 NOTE — Progress Notes (Signed)
Na+ 129, results routed to Dr. Luiz Blare

## 2023-11-15 NOTE — H&P (Signed)
 TOTAL KNEE ADMISSION H&P  Patient is being admitted for right total knee arthroplasty.  Subjective:  Chief Complaint:right knee pain.  HPI: Cassidy Stevens, 63 y.o. female, has a history of pain and functional disability in the right knee due to arthritis and has failed non-surgical conservative treatments for greater than 12 weeks to includecorticosteriod injections, flexibility and strengthening excercises, supervised PT with diminished ADL's post treatment, use of assistive devices, weight reduction as appropriate, and activity modification.  Onset of symptoms was gradual, starting 4 years ago with gradually worsening course since that time. The patient noted no past surgery on the right knee(s).  Patient currently rates pain in the right knee(s) at 9 out of 10 with activity. Patient has night pain, worsening of pain with activity and weight bearing, pain that interferes with activities of daily living, pain with passive range of motion, crepitus, and joint swelling.  Patient has evidence of subchondral sclerosis, periarticular osteophytes, joint subluxation, and joint space narrowing by imaging studies. There is no active infection.  Patient Active Problem List   Diagnosis Date Noted   Closed right hip fracture (HCC) 03/26/2023   Hypokalemia 03/26/2023   Fall at home, initial encounter 03/26/2023   Leukocytosis 03/26/2023   SIRS (systemic inflammatory response syndrome) (HCC) 03/26/2023   Cirrhosis (HCC) 03/26/2023   History of COPD 03/26/2023   Tobacco abuse 03/26/2023   Chronic hyponatremia 03/26/2023   Pain in both lower extremities 11/27/2021   Chronic midline low back pain without sciatica 11/27/2021   Chronic neck pain 11/27/2021   Past Medical History:  Diagnosis Date   Anxiety    Arthritis    Cancer (HCC)    melnoma on nose   Cirrhosis (HCC)    Liver Cirrhosis   COPD (chronic obstructive pulmonary disease) (HCC)    Depression    Fibromyalgia    GERD  (gastroesophageal reflux disease)    Headache    Hepatitis C    IVDU (intravenous drug user)    Remote history   Myocardial infarction (HCC) 2012   seen in [place]   Osteoporosis    Substance abuse (HCC) 2009   Hx ETOH    Past Surgical History:  Procedure Laterality Date   ABDOMINAL HYSTERECTOMY     FACIAL RECONSTRUCTION SURGERY     KNEE SURGERY Right    meniscus repair   TOTAL HIP ARTHROPLASTY Right 03/27/2023   Procedure: TOTAL HIP ARTHROPLASTY ANTERIOR APPROACH;  Surgeon: Gean Birchwood, MD;  Location: [facility];  Service: Orthopedics;  Laterality: Right;    No current facility-administered medications for this encounter.   Current Outpatient Medications  Medication Sig Dispense Refill Last Dose/Taking   albuterol (VENTOLIN HFA) 108 (90 Base) MCG/ACT inhaler Inhale 1-2 puffs into the lungs every 6 (six) hours as needed for wheezing.   Taking As Needed   cetirizine (ZYRTEC) 10 MG tablet Take 10 mg by mouth daily.   Taking   Fluticasone-Umeclidin-Vilant (TRELEGY ELLIPTA) 200-62.5-25 MCG/INH AEPB Inhale 1 puff into the lungs daily. 60 each 11 Taking   gabapentin (NEURONTIN) 600 MG tablet Take 600 mg by mouth 3 (three) times daily.   Taking   HYDROcodone-acetaminophen (NORCO/VICODIN) 5-325 MG tablet Take 1 tablet by mouth every 8 (eight) hours as needed for moderate pain (pain score 4-6).   Taking As Needed   ondansetron (ZOFRAN-ODT) 4 MG disintegrating tablet Take 4 mg by mouth every 8 (eight) hours as needed for nausea or vomiting.   Taking As Needed   PROLIA 60 MG/ML SOSY  injection Inject 60 mg into the skin every 6 (six) months.   Taking   Vitamin D, Ergocalciferol, (DRISDOL) 1.25 MG (50000 UNIT) CAPS capsule Take 50,000 Units by mouth once a week.      No Known Allergies  Social History   Tobacco Use   Smoking status: Every Day    Current packs/day: 0.50    Types: Cigarettes   Smokeless tobacco: Never   Tobacco comments:    Working on quitting  Substance Use Topics    Alcohol use: Not Currently    Comment: Sober since 2009    Family History  Problem Relation Age of Onset   Heart disease Father    Heart disease Sister    Neuropathy Maternal Aunt    Neuropathy Maternal Uncle      Review of Systems neg  Objective:  Physical Exam There were no vitals taken for this visit.  General Appearance:    Alert, cooperative, no distress, appears stated age  Head:    Normocephalic, without obvious abnormality, atraumatic  Eyes:    PERRL, conjunctiva/corneas clear, EOM's intact, fundi    benign, both eyes  Ears:    Normal TM's and external ear canals, both ears  Nose:   Nares normal, septum midline, mucosa normal, no drainage    or sinus tenderness  Throat:   Lips, mucosa, and tongue normal; teeth and gums normal  Neck:   Supple, symmetrical, trachea midline, no adenopathy;    thyroid:  no enlargement/tenderness/nodules; no carotid   bruit or JVD  Back:     Symmetric, no curvature, ROM normal, no CVA tenderness  Lungs:     Clear to auscultation bilaterally, respirations unlabored  Chest Wall:    No tenderness or deformity   Heart:    Regular rate and rhythm, S1 and S2 normal, no murmur, rub   or gallop     Abdomen:     Soft, non-tender, bowel sounds active all four quadrants,    no masses, no organomegaly        Extremities:   Extremities normal, atraumatic, no cyanosis or edema  Pulses:   2+ and symmetric all extremities  Skin:   Skin color, texture, turgor normal, no rashes or lesions  Lymph nodes:   Cervical, supraclavicular, and axillary nodes normal  Neurologic:   CNII-XII intact, normal strength, sensation and reflexes    throughout   Right knee exam: Pain through range of motion.  R OM is -7 to 115 of flexion.  No effusion.  Vital signs in last 24 hours:    Labs: Recent Results (from the past 2160 hours)  Surgical pcr screen     Status: None   Collection Time: 09/01/23 11:34 AM   Specimen: Nasal Mucosa; Nasal Swab  Result Value Ref  Range   MRSA, PCR NEGATIVE NEGATIVE   Staphylococcus aureus NEGATIVE NEGATIVE    Comment: (NOTE) The Xpert SA Assay (FDA approved for NASAL specimens in patients 39 years of age and older), is one component of a comprehensive surveillance program. It is not intended to diagnose infection nor to guide or monitor treatment. Performed at [facility], [address] [place], [place] [address]   Comprehensive metabolic panel per protocol     Status: Abnormal   Collection Time: 09/01/23 11:34 AM  Result Value Ref Range   Sodium 133 (L) 135 - 145 mmol/L   Potassium 4.1 3.5 - 5.1 mmol/L   Chloride 102 98 - 111 mmol/L   CO2  22 22 - 32 mmol/L   Glucose, Bld 103 (H) 70 - 99 mg/dL    Comment: Glucose reference range applies only to samples taken after fasting for at least 8 hours.   BUN 6 (L) 8 - 23 mg/dL   Creatinine, Ser 1.47 (L) 0.44 - 1.00 mg/dL   Calcium 8.8 (L) 8.9 - 10.3 mg/dL   Total Protein 7.3 6.5 - 8.1 g/dL   Albumin 4.1 3.5 - 5.0 g/dL   AST 15 15 - 41 U/L   ALT 13 0 - 44 U/L   Alkaline Phosphatase 94 38 - 126 U/L   Total Bilirubin 0.8 <1.2 mg/dL   GFR, Estimated >82 >95 mL/min    Comment: (NOTE) Calculated using the CKD-EPI Creatinine Equation (2021)    Anion gap 9 5 - 15    Comment: Performed at [facility], [address], [place], [place] [address]  CBC per protocol     Status: None   Collection Time: 09/01/23 11:34 AM  Result Value Ref Range   WBC 6.8 4.0 - 10.5 K/uL   RBC 4.67 3.87 - 5.11 MIL/uL   Hemoglobin 14.7 12.0 - 15.0 g/dL   HCT 86.5 78.4 - 69.6 %   MCV 89.3 80.0 - 100.0 fL   MCH 31.5 26.0 - 34.0 pg   MCHC 35.3 30.0 - 36.0 g/dL   RDW 29.5 28.4 - 13.2 %   Platelets 226 150 - 400 K/uL   nRBC 0.0 0.0 - 0.2 %    Comment: Performed at [facility], [address], [place], [place] [address]  ECHOCARDIOGRAM COMPLETE     Status: None   Collection Time: 10/07/23  3:59 PM  Result Value Ref Range   S'  Lateral 2.50 cm   AV Area VTI 2.83 cm2   AV Mean grad 3.0 mmHg   Single Plane A4C EF 58.5 %   Single Plane A2C EF 66.2 %   Calc EF 64.3 %   AV Area mean vel 2.70 cm2   Area-P 1/2 4.71 cm2   AR max vel 2.79 cm2   AV Peak grad 5.5 mmHg   Ao pk vel 1.17 m/s   MV VTI 3.17 cm2   Est EF 60 - 65%   Surgical pcr screen     Status: None   Collection Time: 11/09/23  9:51 AM   Specimen: Nasal Mucosa; Nasal Swab  Result Value Ref Range   MRSA, PCR NEGATIVE NEGATIVE   Staphylococcus aureus NEGATIVE NEGATIVE    Comment: (NOTE) The Xpert SA Assay (FDA approved for NASAL specimens in patients 64 years of age and older), is one component of a comprehensive surveillance program. It is not intended to diagnose infection nor to guide or monitor treatment. Performed at [facility], [address], [place], [place] [address]   Comprehensive metabolic panel per protocol     Status: Abnormal   Collection Time: 11/09/23 10:16 AM  Result Value Ref Range   Sodium 129 (L) 135 - 145 mmol/L   Potassium 4.6 3.5 - 5.1 mmol/L   Chloride 98 98 - 111 mmol/L   CO2 20 (L) 22 - 32 mmol/L   Glucose, Bld 102 (H) 70 - 99 mg/dL    Comment: Glucose reference range applies only to samples taken after fasting for at least 8 hours.   BUN 6 (L) 8 - 23 mg/dL   Creatinine, Ser 6.64 0.44 - 1.00 mg/dL   Calcium 8.3 (L) 8.9 -  10.3 mg/dL   Total Protein 7.2 6.5 - 8.1 g/dL   Albumin 3.8 3.5 - 5.0 g/dL   AST 16 15 - 41 U/L   ALT 13 0 - 44 U/L   Alkaline Phosphatase 81 38 - 126 U/L   Total Bilirubin 0.7 0.0 - 1.2 mg/dL   GFR, Estimated >60 >45 mL/min    Comment: (NOTE) Calculated using the CKD-EPI Creatinine Equation (2021)    Anion gap 11 5 - 15    Comment: Performed at [facility], [address] [place], [place] [address]  CBC per protocol     Status: None   Collection Time: 11/09/23 10:16 AM  Result Value Ref Range   WBC 8.5 4.0 - 10.5 K/uL   RBC 4.82 3.87 - 5.11 MIL/uL    Hemoglobin 14.8 12.0 - 15.0 g/dL   HCT 19.1 47.8 - 29.5 %   MCV 89.8 80.0 - 100.0 fL   MCH 30.7 26.0 - 34.0 pg   MCHC 34.2 30.0 - 36.0 g/dL   RDW 62.1 30.8 - 65.7 %   Platelets 293 150 - 400 K/uL   nRBC 0.0 0.0 - 0.2 %    Comment: Performed at [facility], [address], [place], [place] [address]     Estimated body mass index is 30 kg/m as calculated from the following:   Height as of 11/09/23: 5\' 2"  (1.575 m).   Weight as of 11/09/23: 74.4 kg.   Imaging Review Plain radiographs demonstrate severe degenerative joint disease of the right knee(s). The overall alignment ismild varus. The bone quality appears to be good for age and reported activity level.      Assessment/Plan:  End stage arthritis, right knee   The patient history, physical examination, clinical judgment of the provider and imaging studies are consistent with end stage degenerative joint disease of the right knee(s) and total knee arthroplasty is deemed medically necessary. The treatment options including medical management, injection therapy arthroscopy and arthroplasty were discussed at length. The risks and benefits of total knee arthroplasty were presented and reviewed. The risks due to aseptic loosening, infection, stiffness, patella tracking problems, thromboembolic complications and other imponderables were discussed. The patient acknowledged the explanation, agreed to proceed with the plan and consent was signed. Patient is being admitted for inpatient treatment for surgery, pain control, PT, OT, prophylactic antibiotics, VTE prophylaxis, progressive ambulation and ADL's and discharge planning. The patient is planning to be discharged home with home health services

## 2023-11-16 ENCOUNTER — Encounter (HOSPITAL_COMMUNITY)
Admission: AD | Disposition: A | Discharge status: Home Health | Payer: Self-pay | Source: Home / Self Care | Attending: Orthopedic Surgery

## 2023-11-16 ENCOUNTER — Ambulatory Visit (HOSPITAL_COMMUNITY): Payer: Medicaid Other | Admitting: Physician Assistant

## 2023-11-16 ENCOUNTER — Other Ambulatory Visit: Payer: Self-pay

## 2023-11-16 ENCOUNTER — Encounter (HOSPITAL_COMMUNITY): Payer: Self-pay | Admitting: Orthopedic Surgery

## 2023-11-16 ENCOUNTER — Inpatient Hospital Stay (HOSPITAL_COMMUNITY)
Admission: AD | Admit: 2023-11-16 | Discharge: 2023-11-19 | DRG: 470 | Disposition: A | Discharge status: Home Health | Payer: Medicaid Other | Attending: Orthopedic Surgery | Admitting: Orthopedic Surgery

## 2023-11-16 ENCOUNTER — Ambulatory Visit (HOSPITAL_COMMUNITY): Payer: Medicaid Other | Admitting: Anesthesiology

## 2023-11-16 DIAGNOSIS — F418 Other specified anxiety disorders: Secondary | ICD-10-CM | POA: Diagnosis not present

## 2023-11-16 DIAGNOSIS — M1711 Unilateral primary osteoarthritis, right knee: Secondary | ICD-10-CM | POA: Diagnosis not present

## 2023-11-16 DIAGNOSIS — Z85820 Personal history of malignant melanoma of skin: Secondary | ICD-10-CM

## 2023-11-16 DIAGNOSIS — J449 Chronic obstructive pulmonary disease, unspecified: Secondary | ICD-10-CM | POA: Diagnosis not present

## 2023-11-16 DIAGNOSIS — Z6830 Body mass index (BMI) 30.0-30.9, adult: Secondary | ICD-10-CM

## 2023-11-16 DIAGNOSIS — I252 Old myocardial infarction: Secondary | ICD-10-CM

## 2023-11-16 DIAGNOSIS — Z96651 Presence of right artificial knee joint: Principal | ICD-10-CM

## 2023-11-16 DIAGNOSIS — F17210 Nicotine dependence, cigarettes, uncomplicated: Secondary | ICD-10-CM | POA: Diagnosis present

## 2023-11-16 DIAGNOSIS — K219 Gastro-esophageal reflux disease without esophagitis: Secondary | ICD-10-CM | POA: Diagnosis present

## 2023-11-16 DIAGNOSIS — Z79899 Other long term (current) drug therapy: Secondary | ICD-10-CM

## 2023-11-16 DIAGNOSIS — Z96641 Presence of right artificial hip joint: Secondary | ICD-10-CM | POA: Diagnosis present

## 2023-11-16 DIAGNOSIS — Z820 Family history of epilepsy and other diseases of the nervous system: Secondary | ICD-10-CM

## 2023-11-16 DIAGNOSIS — Z7982 Long term (current) use of aspirin: Secondary | ICD-10-CM

## 2023-11-16 DIAGNOSIS — Z7951 Long term (current) use of inhaled steroids: Secondary | ICD-10-CM

## 2023-11-16 DIAGNOSIS — Z8249 Family history of ischemic heart disease and other diseases of the circulatory system: Secondary | ICD-10-CM

## 2023-11-16 DIAGNOSIS — E669 Obesity, unspecified: Secondary | ICD-10-CM | POA: Diagnosis present

## 2023-11-16 DIAGNOSIS — M797 Fibromyalgia: Secondary | ICD-10-CM | POA: Diagnosis present

## 2023-11-16 HISTORY — PX: TOTAL KNEE ARTHROPLASTY: SHX125

## 2023-11-16 SURGERY — ARTHROPLASTY, KNEE, TOTAL
Anesthesia: Spinal | Site: Knee | Laterality: Right

## 2023-11-16 MED ORDER — DIPHENHYDRAMINE HCL 12.5 MG/5ML PO ELIX
12.5000 mg | ORAL_SOLUTION | ORAL | Status: DC | PRN
  Administered 2023-11-16: 25 mg via ORAL
  Filled 2023-11-16: qty 10

## 2023-11-16 MED ORDER — NICOTINE 14 MG/24HR TD PT24
14.0000 mg | MEDICATED_PATCH | Freq: Every day | TRANSDERMAL | Status: DC
  Administered 2023-11-16 – 2023-11-19 (×4): 14 mg via TRANSDERMAL
  Filled 2023-11-16 (×4): qty 1

## 2023-11-16 MED ORDER — ONDANSETRON HCL 4 MG PO TABS
4.0000 mg | ORAL_TABLET | Freq: Four times a day (QID) | ORAL | Status: DC | PRN
  Administered 2023-11-18: 4 mg via ORAL
  Filled 2023-11-16: qty 1

## 2023-11-16 MED ORDER — LIDOCAINE HCL (PF) 2 % IJ SOLN
INTRAMUSCULAR | Status: AC
  Filled 2023-11-16: qty 5

## 2023-11-16 MED ORDER — GABAPENTIN 300 MG PO CAPS
600.0000 mg | ORAL_CAPSULE | Freq: Three times a day (TID) | ORAL | Status: DC
Start: 2023-11-16 — End: 2023-11-19
  Administered 2023-11-16 – 2023-11-19 (×9): 600 mg via ORAL
  Filled 2023-11-16 (×9): qty 2

## 2023-11-16 MED ORDER — ACETAMINOPHEN 160 MG/5ML PO SOLN: 325.0000 mg | Freq: Once | ORAL | Status: DC | PRN

## 2023-11-16 MED ORDER — ONDANSETRON HCL 4 MG/2ML IJ SOLN
INTRAMUSCULAR | Status: AC
  Filled 2023-11-16: qty 2

## 2023-11-16 MED ORDER — MIDAZOLAM HCL 5 MG/5ML IJ SOLN
INTRAMUSCULAR | Status: DC | PRN
  Administered 2023-11-16: .5 mg via INTRAVENOUS
  Administered 2023-11-16: 1 mg via INTRAVENOUS
  Administered 2023-11-16: .5 mg via INTRAVENOUS

## 2023-11-16 MED ORDER — ONDANSETRON HCL 4 MG/2ML IJ SOLN: 4.0000 mg | Freq: Four times a day (QID) | INTRAMUSCULAR | Status: DC | PRN

## 2023-11-16 MED ORDER — SODIUM CHLORIDE 0.9 % IV SOLN: INTRAVENOUS | Status: DC

## 2023-11-16 MED ORDER — METHOCARBAMOL 1000 MG/10ML IJ SOLN: 500.0000 mg | Freq: Four times a day (QID) | INTRAMUSCULAR | Status: DC | PRN

## 2023-11-16 MED ORDER — POLYETHYLENE GLYCOL 3350 17 G PO PACK: 17.0000 g | PACK | Freq: Every day | ORAL | Status: DC | PRN

## 2023-11-16 MED ORDER — OXYCODONE HCL 5 MG PO TABS: 5.0000 mg | ORAL_TABLET | ORAL | 0 refills | Status: AC | PRN

## 2023-11-16 MED ORDER — ALBUTEROL SULFATE (2.5 MG/3ML) 0.083% IN NEBU: 2.5000 mg | INHALATION_SOLUTION | Freq: Four times a day (QID) | RESPIRATORY_TRACT | Status: DC | PRN

## 2023-11-16 MED ORDER — DEXAMETHASONE SODIUM PHOSPHATE 10 MG/ML IJ SOLN
INTRAMUSCULAR | Status: AC
  Filled 2023-11-16: qty 1

## 2023-11-16 MED ORDER — METHOCARBAMOL 500 MG PO TABS
500.0000 mg | ORAL_TABLET | Freq: Four times a day (QID) | ORAL | Status: DC | PRN
  Administered 2023-11-16 – 2023-11-18 (×6): 1000 mg via ORAL
  Filled 2023-11-16 (×6): qty 2

## 2023-11-16 MED ORDER — PHENYLEPHRINE HCL (PRESSORS) 10 MG/ML IV SOLN
INTRAVENOUS | Status: DC | PRN
  Administered 2023-11-16: 80 ug via INTRAVENOUS

## 2023-11-16 MED ORDER — ASPIRIN 81 MG PO TBEC: 81.0000 mg | DELAYED_RELEASE_TABLET | Freq: Two times a day (BID) | ORAL | 0 refills | Status: AC

## 2023-11-16 MED ORDER — ROPIVACAINE HCL 5 MG/ML IJ SOLN
INTRAMUSCULAR | Status: DC | PRN
  Administered 2023-11-16: 30 mL via PERINEURAL

## 2023-11-16 MED ORDER — ASPIRIN 81 MG PO CHEW
81.0000 mg | CHEWABLE_TABLET | Freq: Two times a day (BID) | ORAL | Status: DC
  Administered 2023-11-16 – 2023-11-19 (×6): 81 mg via ORAL
  Filled 2023-11-16 (×6): qty 1

## 2023-11-16 MED ORDER — SODIUM CHLORIDE 0.9 % IR SOLN
Status: DC | PRN
  Administered 2023-11-16: 1000 mL

## 2023-11-16 MED ORDER — CEFAZOLIN SODIUM-DEXTROSE 2-4 GM/100ML-% IV SOLN
2.0000 g | INTRAVENOUS | Status: AC
  Administered 2023-11-16: 2 g via INTRAVENOUS
  Filled 2023-11-16: qty 100

## 2023-11-16 MED ORDER — TRANEXAMIC ACID-NACL 1000-0.7 MG/100ML-% IV SOLN
1000.0000 mg | INTRAVENOUS | Status: AC
  Administered 2023-11-16: 1000 mg via INTRAVENOUS
  Filled 2023-11-16: qty 100

## 2023-11-16 MED ORDER — MIDAZOLAM HCL 2 MG/2ML IJ SOLN
INTRAMUSCULAR | Status: AC
  Filled 2023-11-16: qty 2

## 2023-11-16 MED ORDER — 0.9 % SODIUM CHLORIDE (POUR BTL) OPTIME
TOPICAL | Status: DC | PRN
  Administered 2023-11-16: 1000 mL

## 2023-11-16 MED ORDER — LIDOCAINE HCL (CARDIAC) PF 100 MG/5ML IV SOSY
PREFILLED_SYRINGE | INTRAVENOUS | Status: DC | PRN
  Administered 2023-11-16: 40 mg via INTRAVENOUS

## 2023-11-16 MED ORDER — TRANEXAMIC ACID-NACL 1000-0.7 MG/100ML-% IV SOLN
1000.0000 mg | Freq: Once | INTRAVENOUS | Status: AC
  Administered 2023-11-16: 1000 mg via INTRAVENOUS
  Filled 2023-11-16: qty 100

## 2023-11-16 MED ORDER — TIZANIDINE HCL 2 MG PO TABS: 2.0000 mg | ORAL_TABLET | Freq: Four times a day (QID) | ORAL | 0 refills | Status: AC | PRN

## 2023-11-16 MED ORDER — ALUM & MAG HYDROXIDE-SIMETH 200-200-20 MG/5ML PO SUSP: 30.0000 mL | ORAL | Status: DC | PRN

## 2023-11-16 MED ORDER — DROPERIDOL 2.5 MG/ML IJ SOLN: 0.6250 mg | Freq: Once | INTRAMUSCULAR | Status: DC | PRN

## 2023-11-16 MED ORDER — BUPIVACAINE LIPOSOME 1.3 % IJ SUSP
INTRAMUSCULAR | Status: AC
  Filled 2023-11-16: qty 20

## 2023-11-16 MED ORDER — LACTATED RINGERS IV SOLN: INTRAVENOUS | Status: DC

## 2023-11-16 MED ORDER — WATER FOR IRRIGATION, STERILE IR SOLN
Status: DC | PRN
  Administered 2023-11-16: 1000 mL

## 2023-11-16 MED ORDER — CHLORHEXIDINE GLUCONATE 0.12 % MT SOLN
15.0000 mL | Freq: Once | OROMUCOSAL | Status: AC
  Administered 2023-11-16: 15 mL via OROMUCOSAL

## 2023-11-16 MED ORDER — HYDROMORPHONE HCL 1 MG/ML IJ SOLN
0.5000 mg | INTRAMUSCULAR | Status: DC | PRN
  Administered 2023-11-16 – 2023-11-18 (×8): 1 mg via INTRAVENOUS
  Filled 2023-11-16 (×9): qty 1

## 2023-11-16 MED ORDER — HYDROMORPHONE HCL 1 MG/ML IJ SOLN
0.5000 mg | INTRAMUSCULAR | Status: DC | PRN
  Administered 2023-11-16 (×2): 1 mg via INTRAVENOUS
  Filled 2023-11-16 (×2): qty 1

## 2023-11-16 MED ORDER — ORAL CARE MOUTH RINSE: 15.0000 mL | Freq: Once | OROMUCOSAL | Status: AC

## 2023-11-16 MED ORDER — IPRATROPIUM-ALBUTEROL 0.5-2.5 (3) MG/3ML IN SOLN
3.0000 mL | Freq: Once | RESPIRATORY_TRACT | Status: AC
  Administered 2023-11-16: 3 mL via RESPIRATORY_TRACT

## 2023-11-16 MED ORDER — SODIUM CHLORIDE (PF) 0.9 % IJ SOLN
INTRAMUSCULAR | Status: DC | PRN
  Administered 2023-11-16: 100 mL

## 2023-11-16 MED ORDER — MIDAZOLAM HCL 2 MG/2ML IJ SOLN
1.0000 mg | Freq: Once | INTRAMUSCULAR | Status: AC
  Administered 2023-11-16: 1 mg via INTRAVENOUS
  Filled 2023-11-16: qty 2

## 2023-11-16 MED ORDER — PHENYLEPHRINE HCL (PRESSORS) 10 MG/ML IV SOLN
INTRAVENOUS | Status: AC
  Filled 2023-11-16: qty 1

## 2023-11-16 MED ORDER — FENTANYL CITRATE PF 50 MCG/ML IJ SOSY
50.0000 ug | PREFILLED_SYRINGE | Freq: Once | INTRAMUSCULAR | Status: AC
Start: 2023-11-16 — End: 2023-11-16
  Administered 2023-11-16: 50 ug via INTRAVENOUS
  Filled 2023-11-16: qty 2

## 2023-11-16 MED ORDER — METHOCARBAMOL 500 MG PO TABS
500.0000 mg | ORAL_TABLET | Freq: Four times a day (QID) | ORAL | Status: DC | PRN
  Administered 2023-11-16: 500 mg via ORAL
  Filled 2023-11-16: qty 1

## 2023-11-16 MED ORDER — ACETAMINOPHEN 10 MG/ML IV SOLN: 1000.0000 mg | Freq: Once | INTRAVENOUS | Status: DC | PRN

## 2023-11-16 MED ORDER — MENTHOL 3 MG MT LOZG: 1.0000 | LOZENGE | OROMUCOSAL | Status: DC | PRN

## 2023-11-16 MED ORDER — PROPOFOL 1000 MG/100ML IV EMUL
INTRAVENOUS | Status: AC
  Filled 2023-11-16: qty 100

## 2023-11-16 MED ORDER — LORATADINE 10 MG PO TABS
10.0000 mg | ORAL_TABLET | Freq: Every day | ORAL | Status: DC
  Administered 2023-11-16 – 2023-11-19 (×4): 10 mg via ORAL
  Filled 2023-11-16 (×4): qty 1

## 2023-11-16 MED ORDER — PROPOFOL 500 MG/50ML IV EMUL
INTRAVENOUS | Status: DC | PRN
  Administered 2023-11-16: 50 ug/kg/min via INTRAVENOUS
  Administered 2023-11-16: 10 mg via INTRAVENOUS

## 2023-11-16 MED ORDER — HYDROMORPHONE HCL 1 MG/ML IJ SOLN: 0.2500 mg | INTRAMUSCULAR | Status: DC | PRN

## 2023-11-16 MED ORDER — OXYCODONE HCL 5 MG PO TABS
10.0000 mg | ORAL_TABLET | ORAL | Status: DC | PRN
  Administered 2023-11-16 – 2023-11-18 (×9): 15 mg via ORAL
  Filled 2023-11-16 (×9): qty 3

## 2023-11-16 MED ORDER — MEPERIDINE HCL 50 MG/ML IJ SOLN: 6.2500 mg | INTRAMUSCULAR | Status: DC | PRN

## 2023-11-16 MED ORDER — POVIDONE-IODINE 10 % EX SWAB
2.0000 | Freq: Once | CUTANEOUS | Status: AC
Start: 2023-11-16 — End: 2023-11-16
  Administered 2023-11-16: 2 via TOPICAL

## 2023-11-16 MED ORDER — ACETAMINOPHEN 325 MG PO TABS
325.0000 mg | ORAL_TABLET | Freq: Once | ORAL | Status: DC | PRN
Start: 2023-11-16 — End: 2023-11-16

## 2023-11-16 MED ORDER — FLEET ENEMA RE ENEM: 1.0000 | ENEMA | Freq: Once | RECTAL | Status: DC | PRN

## 2023-11-16 MED ORDER — UMECLIDINIUM BROMIDE 62.5 MCG/ACT IN AEPB
1.0000 | INHALATION_SPRAY | Freq: Every day | RESPIRATORY_TRACT | Status: DC
Start: 2023-11-17 — End: 2023-11-19
  Administered 2023-11-17 – 2023-11-19 (×3): 1 via RESPIRATORY_TRACT
  Filled 2023-11-16: qty 7

## 2023-11-16 MED ORDER — PHENYLEPHRINE HCL-NACL 20-0.9 MG/250ML-% IV SOLN
INTRAVENOUS | Status: DC | PRN
  Administered 2023-11-16: 30 ug/min via INTRAVENOUS

## 2023-11-16 MED ORDER — KCL IN DEXTROSE-NACL 20-5-0.45 MEQ/L-%-% IV SOLN
INTRAVENOUS | Status: AC
  Filled 2023-11-16 (×2): qty 1000

## 2023-11-16 MED ORDER — ALBUMIN HUMAN 5 % IV SOLN
INTRAVENOUS | Status: DC | PRN
Start: 2023-11-16 — End: 2023-11-16

## 2023-11-16 MED ORDER — FENTANYL CITRATE (PF) 100 MCG/2ML IJ SOLN
INTRAMUSCULAR | Status: DC | PRN
  Administered 2023-11-16 (×2): 25 ug via INTRAVENOUS
  Administered 2023-11-16: 50 ug via INTRAVENOUS

## 2023-11-16 MED ORDER — METOCLOPRAMIDE HCL 5 MG/ML IJ SOLN: 5.0000 mg | Freq: Three times a day (TID) | INTRAMUSCULAR | Status: DC | PRN

## 2023-11-16 MED ORDER — DEXAMETHASONE SODIUM PHOSPHATE 10 MG/ML IJ SOLN
INTRAMUSCULAR | Status: DC | PRN
  Administered 2023-11-16: 5 mg via INTRAVENOUS

## 2023-11-16 MED ORDER — BUPIVACAINE-EPINEPHRINE (PF) 0.5% -1:200000 IJ SOLN
INTRAMUSCULAR | Status: AC
  Filled 2023-11-16: qty 30

## 2023-11-16 MED ORDER — OXYCODONE HCL 5 MG PO TABS
5.0000 mg | ORAL_TABLET | ORAL | Status: DC | PRN
  Administered 2023-11-16: 10 mg via ORAL
  Filled 2023-11-16: qty 2

## 2023-11-16 MED ORDER — BUPIVACAINE LIPOSOME 1.3 % IJ SUSP: 20.0000 mL | Freq: Once | INTRAMUSCULAR | Status: DC

## 2023-11-16 MED ORDER — SODIUM CHLORIDE (PF) 0.9 % IJ SOLN
INTRAMUSCULAR | Status: AC
  Filled 2023-11-16: qty 50

## 2023-11-16 MED ORDER — METOCLOPRAMIDE HCL 5 MG PO TABS: 5.0000 mg | ORAL_TABLET | Freq: Three times a day (TID) | ORAL | Status: DC | PRN

## 2023-11-16 MED ORDER — BUPIVACAINE IN DEXTROSE 0.75-8.25 % IT SOLN
INTRATHECAL | Status: DC | PRN
  Administered 2023-11-16: 1.8 mL via INTRATHECAL

## 2023-11-16 MED ORDER — IPRATROPIUM-ALBUTEROL 0.5-2.5 (3) MG/3ML IN SOLN
RESPIRATORY_TRACT | Status: AC
  Filled 2023-11-16: qty 3

## 2023-11-16 MED ORDER — FLUTICASONE FUROATE-VILANTEROL 200-25 MCG/ACT IN AEPB
1.0000 | INHALATION_SPRAY | Freq: Every day | RESPIRATORY_TRACT | Status: DC
  Administered 2023-11-17 – 2023-11-19 (×3): 1 via RESPIRATORY_TRACT
  Filled 2023-11-16: qty 28

## 2023-11-16 MED ORDER — PANTOPRAZOLE SODIUM 40 MG PO TBEC
40.0000 mg | DELAYED_RELEASE_TABLET | Freq: Every day | ORAL | Status: DC
  Administered 2023-11-16 – 2023-11-19 (×4): 40 mg via ORAL
  Filled 2023-11-16 (×4): qty 1

## 2023-11-16 MED ORDER — ALBUMIN HUMAN 5 % IV SOLN
INTRAVENOUS | Status: AC
  Filled 2023-11-16: qty 250

## 2023-11-16 MED ORDER — TRAMADOL HCL 50 MG PO TABS
50.0000 mg | ORAL_TABLET | Freq: Four times a day (QID) | ORAL | Status: DC
  Administered 2023-11-16 – 2023-11-19 (×11): 50 mg via ORAL
  Filled 2023-11-16 (×12): qty 1

## 2023-11-16 MED ORDER — DOCUSATE SODIUM 100 MG PO CAPS
100.0000 mg | ORAL_CAPSULE | Freq: Two times a day (BID) | ORAL | Status: DC
  Administered 2023-11-16 – 2023-11-19 (×7): 100 mg via ORAL
  Filled 2023-11-16 (×7): qty 1

## 2023-11-16 MED ORDER — ALPRAZOLAM 0.5 MG PO TABS
0.5000 mg | ORAL_TABLET | Freq: Once | ORAL | Status: AC
  Administered 2023-11-17: 0.5 mg via ORAL
  Filled 2023-11-16: qty 1

## 2023-11-16 MED ORDER — ONDANSETRON HCL 4 MG/2ML IJ SOLN
INTRAMUSCULAR | Status: DC | PRN
  Administered 2023-11-16: 4 mg via INTRAVENOUS

## 2023-11-16 MED ORDER — FENTANYL CITRATE (PF) 100 MCG/2ML IJ SOLN
INTRAMUSCULAR | Status: AC
  Filled 2023-11-16: qty 2

## 2023-11-16 MED ORDER — PHENOL 1.4 % MT LIQD: 1.0000 | OROMUCOSAL | Status: DC | PRN

## 2023-11-16 MED ORDER — BISACODYL 5 MG PO TBEC
5.0000 mg | DELAYED_RELEASE_TABLET | Freq: Every day | ORAL | Status: DC | PRN
  Administered 2023-11-18: 5 mg via ORAL
  Filled 2023-11-16: qty 1

## 2023-11-16 MED ORDER — ACETAMINOPHEN 325 MG PO TABS
325.0000 mg | ORAL_TABLET | Freq: Four times a day (QID) | ORAL | Status: DC | PRN
  Administered 2023-11-17: 650 mg via ORAL
  Filled 2023-11-16: qty 2

## 2023-11-16 SURGICAL SUPPLY — 49 items
ATTUNE PS FEM RT SZ 5 CEM KNEE (Femur) IMPLANT
BAG COUNTER SPONGE SURGICOUNT (BAG) IMPLANT
BAG ZIPLOCK 12X15 (MISCELLANEOUS) ×1 IMPLANT
BASE TIBIAL ROT PLAT SZ 5 KNEE (Knees) IMPLANT
BENZOIN TINCTURE PRP APPL 2/3 (GAUZE/BANDAGES/DRESSINGS) ×1 IMPLANT
BLADE SAGITTAL 25.0X1.19X90 (BLADE) ×1 IMPLANT
BLADE SAW SGTL 13.0X1.19X90.0M (BLADE) ×1 IMPLANT
BLADE SURG SZ10 CARB STEEL (BLADE) ×2 IMPLANT
BNDG ELASTIC 6INX 5YD STR LF (GAUZE/BANDAGES/DRESSINGS) ×1 IMPLANT
BOOTIES KNEE HIGH SLOAN (MISCELLANEOUS) ×1 IMPLANT
BOWL SMART MIX CTS (DISPOSABLE) ×1 IMPLANT
CEMENT HV SMART SET (Cement) ×2 IMPLANT
COVER SURGICAL LIGHT HANDLE (MISCELLANEOUS) ×1 IMPLANT
CUFF TRNQT CYL 34X4.125X (TOURNIQUET CUFF) ×1 IMPLANT
DRAPE INCISE IOBAN 66X45 STRL (DRAPES) ×1 IMPLANT
DRAPE U-SHAPE 47X51 STRL (DRAPES) ×1 IMPLANT
DRSG AQUACEL AG ADV 3.5X10 (GAUZE/BANDAGES/DRESSINGS) ×1 IMPLANT
DURAPREP 26ML APPLICATOR (WOUND CARE) ×1 IMPLANT
ELECT REM PT RETURN 15FT ADLT (MISCELLANEOUS) ×1 IMPLANT
GLOVE BIOGEL PI IND STRL 8 (GLOVE) ×2 IMPLANT
GLOVE ECLIPSE 7.5 STRL STRAW (GLOVE) ×2 IMPLANT
GOWN STRL REUS W/ TWL XL LVL3 (GOWN DISPOSABLE) ×2 IMPLANT
HOLDER FOLEY CATH W/STRAP (MISCELLANEOUS) IMPLANT
HOOD PEEL AWAY T7 (MISCELLANEOUS) ×3 IMPLANT
INSERT TIB ATTUNE RP SZ5X14 (Insert) IMPLANT
KIT TURNOVER KIT A (KITS) IMPLANT
MANIFOLD NEPTUNE II (INSTRUMENTS) ×1 IMPLANT
NDL HYPO 22X1.5 SAFETY MO (MISCELLANEOUS) ×2 IMPLANT
NEEDLE HYPO 22X1.5 SAFETY MO (MISCELLANEOUS) ×2 IMPLANT
NS IRRIG 1000ML POUR BTL (IV SOLUTION) ×1 IMPLANT
PACK TOTAL KNEE CUSTOM (KITS) ×1 IMPLANT
PADDING CAST COTTON 6X4 STRL (CAST SUPPLIES) ×1 IMPLANT
PATELLA MEDIAL ATTUN 35MM KNEE (Knees) IMPLANT
PIN STEINMAN FIXATION KNEE (PIN) IMPLANT
PROTECTOR NERVE ULNAR (MISCELLANEOUS) ×1 IMPLANT
SET HNDPC FAN SPRY TIP SCT (DISPOSABLE) ×1 IMPLANT
SPIKE FLUID TRANSFER (MISCELLANEOUS) ×2 IMPLANT
STRIP CLOSURE SKIN 1/2X4 (GAUZE/BANDAGES/DRESSINGS) IMPLANT
SUT MNCRL AB 3-0 PS2 18 (SUTURE) ×1 IMPLANT
SUT VIC AB 0 CT1 36 (SUTURE) ×1 IMPLANT
SUT VIC AB 1 CT1 36 (SUTURE) ×2 IMPLANT
SUT VIC AB 2-0 CT1 TAPERPNT 27 (SUTURE) ×1 IMPLANT
SUT VICRYL+ 3-0 36IN CT-1 (SUTURE) IMPLANT
SYR CONTROL 10ML LL (SYRINGE) ×2 IMPLANT
TIBIAL BASE ROT PLAT SZ 5 KNEE (Knees) ×1 IMPLANT
TRAY CATH INTERMITTENT SS 16FR (CATHETERS) IMPLANT
TUBE SUCTION HIGH CAP CLEAR NV (SUCTIONS) ×1 IMPLANT
WATER STERILE IRR 1000ML POUR (IV SOLUTION) ×2 IMPLANT
WRAP KNEE MAXI GEL POST OP (GAUZE/BANDAGES/DRESSINGS) ×1 IMPLANT

## 2023-11-16 NOTE — Discharge Instructions (Signed)

## 2023-11-16 NOTE — Evaluation (Signed)
 Physical Therapy Evaluation Patient Details Name: Cassidy Stevens MRN: [id number] DOB: [birth year] Today's Date: 11/16/2023  History of Present Illness  63 yo female  s/p TKA on 11/17/23. PMH: R THA,  cirrhosis, COPD  Clinical Impression  Pt is s/p TKA resulting in the deficits listed below (see PT Problem List).  Pt known to me from previous surgery. Pt motivated to work with PT however ltd d/t residual effects of anesthesia, decr quad activation and difficulty maintaining knee extension with WBing.  Gait distance also limited d/t pt needing to use BSC urgently  secondary to not having catheter, pt also reports fatigue from bed change ealier d/t incontinence (d/t decr sensation in peri area)  Pt will benefit from acute skilled PT to increase their independence and safety with mobility to allow discharge.          If plan is discharge home, recommend the following: Help with stairs or ramp for entrance;Assistance with cooking/housework;Assist for transportation   Can travel by private vehicle        Equipment Recommendations None recommended by PT  Recommendations for Other Services       Functional Status Assessment Patient has had a recent decline in their functional status and demonstrates the ability to make significant improvements in function in a reasonable and predictable amount of time.     Precautions / Restrictions Precautions Precautions: Fall;Knee Restrictions RLE Weight Bearing Per Provider Order: Weight bearing as tolerated      Mobility  Bed Mobility Overal bed mobility: Needs Assistance Bed Mobility: Supine to Sit     Supine to sit: Contact guard     General bed mobility comments: for lines and safety    Transfers Overall transfer level: Needs assistance Equipment used: Rolling walker (2 wheels) Transfers: Sit to/from Stand, Bed to chair/wheelchair/BSC Sit to Stand: Min assist   Step pivot transfers: Min assist       General transfer  comment: cues for hand placement and safety    Ambulation/Gait Ambulation/Gait assistance: Min assist Gait Distance (Feet): 10 Feet Assistive device: Rolling walker (2 wheels) Gait Pattern/deviations: Step-to pattern       General Gait Details: cues for safety, RW position. pt reluctant to attempt placing wt on RLE d/t diminished sensation  Stairs            Wheelchair Mobility     Tilt Bed    Modified Rankin (Stroke Patients Only)       Balance Overall balance assessment: Mild deficits observed, not formally tested                                           Pertinent Vitals/Pain Pain Assessment Pain Assessment: 0-10 Pain Score: 5  Pain Location: right knee Pain Descriptors / Indicators: Sore, Numbness Pain Intervention(s): Limited activity within patient's tolerance, Monitored during session, Repositioned    Home Living Family/patient expects to be discharged to:: Private residence Living Arrangements: Children;Other relatives Available Help at Discharge: Family Type of Home: Mobile home Home Access: Stairs to enter Entrance Stairs-Rails: Right Entrance Stairs-Number of Steps: 4   Home Layout: One level Home Equipment: Agricultural consultant (2 wheels)      Prior Function Prior Level of Function : Independent/Modified Independent             Mobility Comments: no device prior to surgery ADLs Comments: independent with ADLs, driving, cooking, and cleaning.  Extremity/Trunk Assessment   Upper Extremity Assessment Upper Extremity Assessment: Overall WFL for tasks assessed    Lower Extremity Assessment Lower Extremity Assessment: RLE deficits/detail RLE Deficits / Details: ankle WFL, knee extension and hip flexion 2+/5, ltd d/t anticipated post op changes RLE: Unable to fully assess due to pain       Communication   Communication Communication: No apparent difficulties    Cognition Arousal: Alert Behavior During Therapy:  WFL for tasks assessed/performed   PT - Cognitive impairments: No apparent impairments                         Following commands: Intact       Cueing Cueing Techniques: Verbal cues     General Comments      Exercises     Assessment/Plan    PT Assessment Patient needs continued PT services  PT Problem List Decreased strength;Decreased activity tolerance;Decreased balance;Decreased mobility;Pain;Decreased knowledge of use of DME;Decreased range of motion       PT Treatment Interventions DME instruction;Therapeutic exercise;Gait training;Stair training;Functional mobility training;Therapeutic activities;Patient/family education    PT Goals (Current goals can be found in the Care Plan section)  Acute Rehab PT Goals PT Goal Formulation: With patient Time For Goal Achievement: 11/23/23 Potential to Achieve Goals: Good    Frequency 7X/week     Co-evaluation               AM-PAC PT "6 Clicks" Mobility  Outcome Measure Help needed turning from your back to your side while in a flat bed without using bedrails?: A Little Help needed moving from lying on your back to sitting on the side of a flat bed without using bedrails?: A Little Help needed moving to and from a bed to a chair (including a wheelchair)?: A Little Help needed standing up from a chair using your arms (e.g., wheelchair or bedside chair)?: A Little Help needed to walk in hospital room?: A Lot Help needed climbing 3-5 steps with a railing? : A Lot 6 Click Score: 16    End of Session Equipment Utilized During Treatment: Gait belt Activity Tolerance: Patient tolerated treatment well;Other (comment) (residual effects of anesthesia RLE) Patient left: in bed;with call bell/phone within reach;with bed alarm set Nurse Communication: Mobility status PT Visit Diagnosis: Other abnormalities of gait and mobility (R26.89)    Time: 5784-6962 PT Time Calculation (min) (ACUTE ONLY): 15 min   Charges:    PT Evaluation $PT Eval Low Complexity: 1 Low   PT General Charges $$ ACUTE PT VISIT: 1 Visit         Montserrath Madding, PT  Acute Rehab Dept [facility]) [phone]  11/16/2023   [facility] 11/16/2023, 4:19 PM

## 2023-11-16 NOTE — Interval H&P Note (Signed)
 History and Physical Interval Note:  11/16/2023 8:45 AM  Cassidy Stevens  has presented today for surgery, with the diagnosis of RIGHT KNEE SEVERE DEGENERATIVE JOINT DISEASE.  The various methods of treatment have been discussed with the patient and family. After consideration of risks, benefits and other options for treatment, the patient has consented to  Procedure(s): TOTAL KNEE ARTHROPLASTY (Right) as a surgical intervention.  The patient's history has been reviewed, patient examined, no change in status, stable for surgery.  I have reviewed the patient's chart and labs.  Questions were answered to the patient's satisfaction.     Harvie Junior

## 2023-11-16 NOTE — Progress Notes (Signed)
 Orthopedic Tech Progress Note Patient Details:  Cassidy Stevens April 06, 1961 [id number]  Ortho Devices Type of Ortho Device: Bone foam zero knee Ortho Device/Splint Location: RLE Ortho Device/Splint Interventions: Ordered, Application, Adjustment   Post Interventions Patient Tolerated: Well Instructions Provided: Care of device  [name] 11/16/2023, 12:51 PM

## 2023-11-16 NOTE — Transfer of Care (Signed)
 Immediate Anesthesia Transfer of Care Note  Patient: Cassidy Stevens  Procedure(s) Performed: RIGHT TOTAL KNEE ARTHROPLASTY (Right: Knee)  Patient Location: PACU  Anesthesia Type:Spinal  Level of Consciousness: awake, alert , oriented, and patient cooperative  Airway & Oxygen Therapy: Patient Spontanous Breathing and Patient connected to face mask oxygen  Post-op Assessment: Report given to RN and Post -op Vital signs reviewed and stable  Post vital signs: Reviewed and stable  Last Vitals:  Vitals Value Taken Time  BP 97/68 11/16/23 1220  Temp    Pulse 71 11/16/23 1222  Resp 18 11/16/23 1222  SpO2 100 % 11/16/23 1222  Vitals shown include unfiled device data.  Last Pain:  Vitals:   11/16/23 0940  TempSrc:   PainSc: 0-No pain         Complications: No notable events documented.

## 2023-11-16 NOTE — Anesthesia Postprocedure Evaluation (Addendum)
 Anesthesia Post Note  Patient: Cassidy Stevens  Procedure(s) Performed: RIGHT TOTAL KNEE ARTHROPLASTY (Right: Knee)     Patient location during evaluation: PACU Anesthesia Type: Spinal Level of consciousness: oriented and awake and alert Pain management: pain level controlled Vital Signs Assessment: post-procedure vital signs reviewed and stable Respiratory status: spontaneous breathing, respiratory function stable and patient connected to nasal cannula oxygen Cardiovascular status: blood pressure returned to baseline and stable Postop Assessment: no headache, no backache and no apparent nausea or vomiting Anesthetic complications: no  No notable events documented.  Last Vitals:  Vitals:   11/16/23 1315 11/16/23 1401  BP: 125/81 (!) 141/84  Pulse: 71 74  Resp: 18 16  Temp: (!) 36.1 C   SpO2: 100% 99%                 Shelton Silvas

## 2023-11-16 NOTE — Op Note (Signed)
 PATIENT ID:      Cassidy Stevens  MRN:     [id number] DOB/AGE:    [birth year] / 63 y.o.       OPERATIVE REPORT   DATE OF PROCEDURE:  11/16/2023      PREOPERATIVE DIAGNOSIS:   RIGHT KNEE SEVERE DEGENERATIVE JOINT DISEASE      Estimated body mass index is 30 kg/m as calculated from the following:   Height as of 11/09/23: 5\' 2"  (1.575 m).   Weight as of this encounter: 74.4 kg.                                                       POSTOPERATIVE DIAGNOSIS:   Same                                                                  PROCEDURE:  Procedure(s): RIGHT TOTAL KNEE ARTHROPLASTY Using DepuyAttune RP implants #5 Femur, #5Tibia, 14 mm Attune RP bearing, 35 Patella    SURGEON: Harvie Junior  ASSISTANT:   Tomi Likens. [employer]   (Present and scrubbed throughout the case, critical for assistance with exposure, retraction, instrumentation, and closure.)        ANESTHESIA: spinal, 20cc Exparel, 50cc 0.25% Marcaine EBL: min cc FLUID REPLACEMENT: unk cc crystaloid TOURNIQUET: DRAINS: None TRANEXAMIC ACID: 1gm IV, 2gm topical COMPLICATIONS:  None         INDICATIONS FOR PROCEDURE: The patient has  RIGHT KNEE SEVERE DEGENERATIVE JOINT DISEASE, severe varus deformities, XR shows bone on bone arthritis, lateral subluxation of tibia. Patient has failed all conservative measures including anti-inflammatory medicines, narcotics, attempts at exercise and weight loss, cortisone injections and viscosupplementation.  Risks and benefits of surgery have been discussed, questions answered.   DESCRIPTION OF PROCEDURE: The patient identified by armband, received  IV antibiotics, in the holding area at [facility]. Patient taken to the operating room, appropriate anesthetic monitors were attached, and spinal anesthesia was  induced. IV Tranexamic acid was given. Lateral post and 2 surefoot positioners applied to the table, the lower extremity was then prepped and draped in usual sterile fashion from  the toes to the high thigh. Time-out procedure was performed. Tomi Likens. [facility] PAC, was present and scrubbed throughout the case, critical for assistance with, positioning, exposure, retraction, instrumentation, and closure.The skin and subcutaneous tissue along the incision was injected with 20 cc of a mixture of 20cc Exparel and 30cc Marcaine 50cc saline solution, using a 21-gauge by 1-1/2 inch needle. We began the operation, with the knee flexed 130 degrees, by making the anterior midline incision starting at handbreadth above the patella going over the patella 1 cm medial to and 4 cm distal to the tibial tubercle. Small bleeders in the skin and the subcutaneous tissue identified and cauterized. Transverse retinaculum was incised and reflected medially and a medial parapatellar arthrotomy was accomplished. the patella was everted and theprepatellar fat pad resected. The superficial medial collateral ligament was then elevated from anterior to posterior along the proximal flare of the tibia and anterior half of the menisci resected. The knee was hyperflexed  exposing bone on bone arthritis. Peripheral and notch osteophytes as well as the cruciate ligaments were then resected. We continued to work our way around posteriorly along the proximal tibia, and externally rotated the tibia subluxing it out from underneath the femur. A McHale PCL retractor was placed through the notch, a lateral Hohmann retractor, and anterolateral small homan retractor placed. We then entered the proximal tibia with the Depuy starter drill in line with the axis of the tibia followed by an intramedullary guide rod and 3-degree posterior slope cutting guide. The tibial cutting guide, was pinned into place allowing resection of 2 mm of bone medially and 18 mm of bone laterally. Satisfied with the tibial resection, we then entered the distal femur 2 mm anterior to the PCL origin with the starter drill, followed by the intramedullary guide rod  and applied the distal femoral cutting guide set at 9 mm, with 5 degrees of valgus. This was pinned along the epicondylar axis. At this point, the distal femoral cut was accomplished without difficulty. We then sized for a #5 femoral component and pinned the chamfer guide in 3 degrees of external rotation. The anterior, posterior, and chamfer cuts were accomplished without difficulty followed by the Attune RP box cutting guide and the box cut. We also removed posterior osteophytes from the posterior femoral condyles. The posterior capsule was injected with Exparel solution. The knee was brought into full extension. We checked our extension gap and fit a 14 mm trial lollipop. Distracting in extension with a lamina spreader,  bleeders in the posterior capsule, Posterior medial and posterior lateral gutter were cauterized.  The transexamic acid-soaked sponge was then placed in the gap of the knee in extension. The knee was flexed 30. The posterior patella cut was accomplished with the 9.5 mm Attune cutting guide, sized for a 35 mm dome, and the fixation pegs drilled.The knee was then once again hyperflexed exposing the proximal tibia. We sized for a # 5 tibial base plate, applied the smokestack and the conical reamer followed by the the Delta fin keel punch. We then hammered into place the Attune RP trial femoral component, drilled the lugs, inserted a  14 mm trial bearing, trial patellar button, and took the knee through range of motion from 0-130 degrees. Medial and lateral ligamentous stability was checked. No thumb pressure was required for patellar Tracking.  All trial components were removed, mating surfaces irrigated with pulse lavage, and dried with suction and sponges. 10 cc of the Exparel solution was applied to the cancellus bone of the patella distal femur and proximal tibia.  After waiting 30 seconds, the bony surfaces were again, dried with sponges. A double batch of DePuy HV cement was mixed and applied  to all bony metallic mating surfaces except for the posterior condyles of the femur itself. In order, we hammered into place the tibial tray and removed excess cement, the femoral component and removed excess cement. The final Attune RP bearing was inserted, and the knee brought to full extension with compression. The patellar button was clamped into place, and excess cement removed. The knee was held at 30 flexion with compression using the second surefoot, while the cement cured. The wound was irrigated out with normal saline solution pulse lavage. The rest of the Exparel was injected into the parapatellar arthrotomy, subcutaneous tissues, and periosteal tissues. The parapatellar arthrotomy was closed with running #1 Vicryl suture. The subcutaneous tissue with 3-0 undyed Vicryl suture, and the skin with running 3-0 SQ vicryl.  An Aquacil dressing and Ace wrap were applied. The patient was taken to recovery room without difficulty.   Harvie Junior 11/16/2023, 12:18 PM

## 2023-11-16 NOTE — Anesthesia Procedure Notes (Signed)
 Spinal  Patient location during procedure: OR Start time: 11/16/2023 10:03 AM End time: 11/16/2023 10:07 AM Reason for block: surgical anesthesia Staffing Performed: resident/CRNA  Resident/CRNA: Garth Bigness, CRNA Performed by: Garth Bigness, CRNA Authorized by: Shelton Silvas, MD   Preanesthetic Checklist Completed: patient identified, IV checked, site marked, risks and benefits discussed, surgical consent, monitors and equipment checked, pre-op evaluation and timeout performed Spinal Block Patient position: sitting Prep: DuraPrep Patient monitoring: heart rate, continuous pulse ox and blood pressure Location: L4-5 Injection technique: single-shot Needle Needle type: Pencan  Needle gauge: 24 G Needle length: 9 cm Needle insertion depth: 4 cm Assessment Sensory level: T8 Events: CSF return Additional Notes

## 2023-11-16 NOTE — Anesthesia Procedure Notes (Signed)
 Anesthesia Regional Block: Adductor canal block   Pre-Anesthetic Checklist: , timeout performed,  Correct Patient, Correct Site, Correct Laterality,  Correct Procedure, Correct Position, site marked,  Risks and benefits discussed,  Surgical consent,  Pre-op evaluation,  At surgeon's request and post-op pain management  Laterality: Right  Prep: chloraprep       Needles:  Injection technique: Single-shot  Needle Type: Echogenic Stimulator Needle     Needle Length: 9cm  Needle Gauge: 21     Additional Needles:   Procedures:,,,, ultrasound used (permanent image in chart),,    Narrative:  Start time: 11/16/2023 9:30 AM End time: 11/16/2023 9:35 AM Injection made incrementally with aspirations every 5 mL.  Performed by: Personally  Anesthesiologist: Shelton Silvas, MD  Additional Notes: Discussed risks and benefits of the nerve block in detail, including but not limited vascular injury, permanent nerve damage and infection.   Patient tolerated the procedure well. Local anesthetic introduced in an incremental fashion under minimal resistance after negative aspirations. No paresthesias were elicited. After completion of the procedure, no acute issues were identified and patient continued to be monitored by RN.

## 2023-11-17 ENCOUNTER — Encounter (HOSPITAL_COMMUNITY): Payer: Self-pay | Admitting: Orthopedic Surgery

## 2023-11-17 DIAGNOSIS — Z7951 Long term (current) use of inhaled steroids: Secondary | ICD-10-CM | POA: Diagnosis not present

## 2023-11-17 DIAGNOSIS — Z8249 Family history of ischemic heart disease and other diseases of the circulatory system: Secondary | ICD-10-CM | POA: Diagnosis not present

## 2023-11-17 DIAGNOSIS — Z7982 Long term (current) use of aspirin: Secondary | ICD-10-CM | POA: Diagnosis not present

## 2023-11-17 DIAGNOSIS — E669 Obesity, unspecified: Secondary | ICD-10-CM | POA: Diagnosis present

## 2023-11-17 DIAGNOSIS — K219 Gastro-esophageal reflux disease without esophagitis: Secondary | ICD-10-CM | POA: Diagnosis present

## 2023-11-17 DIAGNOSIS — Z79899 Other long term (current) drug therapy: Secondary | ICD-10-CM | POA: Diagnosis not present

## 2023-11-17 DIAGNOSIS — M797 Fibromyalgia: Secondary | ICD-10-CM | POA: Diagnosis present

## 2023-11-17 DIAGNOSIS — F17210 Nicotine dependence, cigarettes, uncomplicated: Secondary | ICD-10-CM | POA: Diagnosis present

## 2023-11-17 DIAGNOSIS — M25561 Pain in right knee: Secondary | ICD-10-CM | POA: Diagnosis present

## 2023-11-17 DIAGNOSIS — Z85820 Personal history of malignant melanoma of skin: Secondary | ICD-10-CM | POA: Diagnosis not present

## 2023-11-17 DIAGNOSIS — J449 Chronic obstructive pulmonary disease, unspecified: Secondary | ICD-10-CM | POA: Diagnosis present

## 2023-11-17 DIAGNOSIS — I252 Old myocardial infarction: Secondary | ICD-10-CM | POA: Diagnosis not present

## 2023-11-17 DIAGNOSIS — Z820 Family history of epilepsy and other diseases of the nervous system: Secondary | ICD-10-CM | POA: Diagnosis not present

## 2023-11-17 DIAGNOSIS — Z96641 Presence of right artificial hip joint: Secondary | ICD-10-CM | POA: Diagnosis present

## 2023-11-17 DIAGNOSIS — Z6830 Body mass index (BMI) 30.0-30.9, adult: Secondary | ICD-10-CM | POA: Diagnosis not present

## 2023-11-17 DIAGNOSIS — M1711 Unilateral primary osteoarthritis, right knee: Secondary | ICD-10-CM | POA: Diagnosis present

## 2023-11-17 LAB — GLUCOSE, CAPILLARY
Glucose-Capillary: 151 mg/dL — ABNORMAL HIGH (ref 70–99)
Glucose-Capillary: 134 mg/dL — ABNORMAL HIGH (ref 70–99)
Glucose-Capillary: 120 mg/dL — ABNORMAL HIGH (ref 70–99)

## 2023-11-17 MED ORDER — KETOROLAC TROMETHAMINE 30 MG/ML IJ SOLN
15.0000 mg | Freq: Four times a day (QID) | INTRAMUSCULAR | Status: AC
  Administered 2023-11-17 (×3): 15 mg via INTRAVENOUS
  Filled 2023-11-17 (×3): qty 1

## 2023-11-17 NOTE — Progress Notes (Signed)
 Physical Therapy Treatment Patient Details Name: Cassidy Stevens MRN: [id number] DOB: [birth year] Today's Date: 11/17/2023   History of Present Illness 63 yo female  s/p TKA on 11/17/23. PMH: R THA,  cirrhosis, COPD    PT Comments  Pt progressing slowly but steadily; able to amb 12' x2 (to and from bathroom) this pm and demonstrate incr wt shift to RLE during gait.  Pt verbalizes the importance of terminal knee extension and demonstrates increased compliance this afternoon. R knee is edematous although improved from am; pt deferred TKA exercises d/t pain. provided with ice to R knee EOS.    If plan is discharge home, recommend the following: Help with stairs or ramp for entrance;Assistance with cooking/housework;Assist for transportation   Can travel by private vehicle        Equipment Recommendations  None recommended by PT    Recommendations for Other Services       Precautions / Restrictions Precautions Precautions: Fall;Knee Recall of Precautions/Restrictions: Intact Precaution/Restrictions Comments: pt verbalizes importance of terminal knee extension; extension noted to be  improved from am session Restrictions Weight Bearing Restrictions Per Provider Order: No RLE Weight Bearing Per Provider Order: Weight bearing as tolerated     Mobility  Bed Mobility Overal bed mobility: Needs Assistance Bed Mobility: Supine to Sit, Sit to Supine     Supine to sit: Supervision Sit to supine: Contact guard assist   General bed mobility comments: pt able to self assist RLE using gait belt, incr time needed    Transfers Overall transfer level: Needs assistance Equipment used: Rolling walker (2 wheels) Transfers: Sit to/from Stand, Bed to chair/wheelchair/BSC Sit to Stand: Min assist, Contact guard assist   Step pivot transfers: Min assist       General transfer comment: cues for hand placement and safety; STS from bed and toilet    Ambulation/Gait Ambulation/Gait  assistance: Min assist, Contact guard assist Gait Distance (Feet): 12 Feet (x2) Assistive device: Rolling walker (2 wheels) Gait Pattern/deviations: Step-to pattern       General Gait Details: cues for foot flat  RLE, to incr wt shift to R as able, safety, RW position. (pivotal steps only, pt declines amb d/t pain)   Stairs             Wheelchair Mobility     Tilt Bed    Modified Rankin (Stroke Patients Only)       Balance                                            Communication Communication Communication: No apparent difficulties  Cognition Arousal: Alert Behavior During Therapy: WFL for tasks assessed/performed   PT - Cognitive impairments: No apparent impairments                       PT - Cognition Comments: pt states her pain is too bad to move and that she is also too tired Following commands: Intact      Cueing Cueing Techniques: Verbal cues  Exercises      General Comments        Pertinent Vitals/Pain Pain Assessment Pain Assessment: 0-10 Pain Score: 8  Pain Location: right knee Pain Descriptors / Indicators: Sore, Grimacing, Guarding Pain Intervention(s): Limited activity within patient's tolerance, Monitored during session, Premedicated before session, Repositioned, Patient requesting pain meds-RN notified, Ice applied  Home Living                          Prior Function            PT Goals (current goals can now be found in the care plan section) Acute Rehab PT Goals PT Goal Formulation: With patient Time For Goal Achievement: 11/23/23 Potential to Achieve Goals: Good Progress towards PT goals: Progressing toward goals    Frequency    7X/week      PT Plan      Co-evaluation              AM-PAC PT "6 Clicks" Mobility   Outcome Measure  Help needed turning from your back to your side while in a flat bed without using bedrails?: A Little Help needed moving from lying on  your back to sitting on the side of a flat bed without using bedrails?: A Little Help needed moving to and from a bed to a chair (including a wheelchair)?: A Little Help needed standing up from a chair using your arms (e.g., wheelchair or bedside chair)?: A Little Help needed to walk in hospital room?: A Lot Help needed climbing 3-5 steps with a railing? : A Lot 6 Click Score: 16    End of Session Equipment Utilized During Treatment: Gait belt Activity Tolerance: Patient limited by pain;Patient tolerated treatment well Patient left: in bed;with call bell/phone within reach;with bed alarm set Nurse Communication: Mobility status PT Visit Diagnosis: Other abnormalities of gait and mobility (R26.89)     Time: 1610-9604 PT Time Calculation (min) (ACUTE ONLY): 18 min  Charges:    $Gait Training: 8-22 mins PT General Charges $$ ACUTE PT VISIT: 1 Visit                     Sona Nations, PT  Acute Rehab Dept [facility]) [phone]  11/17/2023    [facility] 11/17/2023, 4:54 PM

## 2023-11-17 NOTE — Progress Notes (Signed)
 Subjective: 1 Day Post-Op Procedure(s) (LRB): RIGHT TOTAL KNEE ARTHROPLASTY (Right) Patient reports pain as severe. The patient had pain in her right knee overnight requiring IV pain medication.  She also is struggling with ambulation in physical therapy due to her pain.  We added Toradol 15 mg q.6 hours IV.  She is voiding and taking by mouth okay.   Objective: Vital signs in last 24 hours: Temp:  [97 F (36.1 C)-98.4 F (36.9 C)] 98 F (36.7 C) (02/18 0850) Pulse Rate:  [66-99] 88 (02/18 0850) Resp:  [11-20] 20 (02/18 0850) BP: (99-172)/(61-99) 172/99 (02/18 0850) SpO2:  [97 %-100 %] 98 % (02/18 0850)  Intake/Output from previous day: 02/17 0701 - 02/18 0700 In: 3677.5 [P.O.:1280; I.V.:1947.5; IV Piggyback:450] Out: 1810 [Urine:1780; Blood:30] Intake/Output this shift: Total I/O In: -  Out: 500 [Urine:500]  No results for input(s): "HGB" in the last 72 hours. No results for input(s): "WBC", "RBC", "HCT", "PLT" in the last 72 hours. No results for input(s): "NA", "K", "CL", "CO2", "BUN", "CREATININE", "GLUCOSE", "CALCIUM" in the last 72 hours. No results for input(s): "LABPT", "INR" in the last 72 hours. Right knee exam: She has moderate swelling.  Calf is soft and nontender.  No ankle plantar or dorsiflex or weakness. Sensation intact distally Intact pulses distally Dorsiflexion/Plantar flexion intact Incision: dressing C/D/I No cellulitis present Compartment soft   Assessment/Plan: 1 Day Post-Op Procedure(s) (LRB): RIGHT TOTAL KNEE ARTHROPLASTY (Right) Plan: Weight-bear as tolerated on right lower extremity. Up with therapy Discharge home with home healthOnce we get her pain under better management and she passes physical therapy safely. Continue IV Toradol 15 mg q. 6 hours 3 doses.  Continue with oral and IV narcotic pain management.   Anticipated LOS equal to or greater than 2 midnights due to - Age 7 and older with one or more of the following:  - Obesity  -  Expected need for hospital services (PT, OT, Nursing) required for safe  discharge  - Anticipated need for postoperative skilled nursing care or inpatient rehab  - Active co-morbidities: Chronic pain requiring opiods OR   - Unanticipated findings during/Post Surgery: Slow post-op progression: GI, pain control, mobility  - Patient is a high risk of re-admission due to: Barriers to post-acute care (logistical, no family support in home)   Matthew Folks 11/17/2023, 12:28 PM

## 2023-11-17 NOTE — Progress Notes (Signed)
 Physical Therapy Treatment Patient Details Name: Cassidy Stevens MRN: [id number] DOB: May 20, 1961 Today's Date: 11/17/2023   History of Present Illness 63 yo female  s/p TKA on 11/17/23. PMH: R THA,  cirrhosis, COPD    PT Comments  Pt limited by pain, agreeable to getting up to [facility], declined amb d/t pain    If plan is discharge home, recommend the following: Help with stairs or ramp for entrance;Assistance with cooking/housework;Assist for transportation   Can travel by private vehicle        Equipment Recommendations  None recommended by PT    Recommendations for Other Services       Precautions / Restrictions Precautions Precautions: Fall;Knee Recall of Precautions/Restrictions: Intact Restrictions Weight Bearing Restrictions Per Provider Order: No RLE Weight Bearing Per Provider Order: Weight bearing as tolerated     Mobility  Bed Mobility Overal bed mobility: Needs Assistance Bed Mobility: Supine to Sit, Sit to Supine     Supine to sit: Min assist Sit to supine: Min assist   General bed mobility comments: assist with RLE d/t pain    Transfers Overall transfer level: Needs assistance Equipment used: Rolling walker (2 wheels) Transfers: Sit to/from Stand, Bed to chair/wheelchair/BSC Sit to Stand: Min assist   Step pivot transfers: Min assist       General transfer comment: cues for hand placement and safety    Ambulation/Gait               General Gait Details:  (pivotal steps only, pt declines amb d/t pain)   Stairs             Wheelchair Mobility     Tilt Bed    Modified Rankin (Stroke Patients Only)       Balance                                            Communication Communication Communication: No apparent difficulties  Cognition Arousal: Alert Behavior During Therapy: WFL for tasks assessed/performed   PT - Cognitive impairments: No apparent impairments                       PT -  Cognition Comments: pt states her pain is too bad to move and that she is also too tired Following commands: Intact      Cueing Cueing Techniques: Verbal cues  Exercises      General Comments General comments (skin integrity, edema, etc.): encouraged knee extension      Pertinent Vitals/Pain Pain Assessment Pain Assessment: 0-10 Pain Score: 8  Pain Location: right knee Pain Descriptors / Indicators: Sore, Grimacing, Guarding Pain Intervention(s): Limited activity within patient's tolerance, Monitored during session, Premedicated before session, Repositioned, Ice applied    Home Living                          Prior Function            PT Goals (current goals can now be found in the care plan section) Acute Rehab PT Goals PT Goal Formulation: With patient Time For Goal Achievement: 11/23/23 Potential to Achieve Goals: Good Progress towards PT goals: Progressing toward goals    Frequency    7X/week      PT Plan      Co-evaluation  AM-PAC PT "6 Clicks" Mobility   Outcome Measure  Help needed turning from your back to your side while in a flat bed without using bedrails?: A Little Help needed moving from lying on your back to sitting on the side of a flat bed without using bedrails?: A Little Help needed moving to and from a bed to a chair (including a wheelchair)?: A Little Help needed standing up from a chair using your arms (e.g., wheelchair or bedside chair)?: A Little Help needed to walk in hospital room?: A Lot Help needed climbing 3-5 steps with a railing? : A Lot 6 Click Score: 16    End of Session Equipment Utilized During Treatment: Gait belt Activity Tolerance: Patient limited by pain Patient left: in bed;with call bell/phone within reach;with bed alarm set Nurse Communication: Mobility status PT Visit Diagnosis: Other abnormalities of gait and mobility (R26.89)     Time: 7829-5621 PT Time Calculation (min) (ACUTE  ONLY): 16 min  Charges:    $Therapeutic Activity: 8-22 mins PT General Charges $$ ACUTE PT VISIT: 1 Visit                     Delice Bison, PT  Acute Rehab Dept [facility]) [phone]  11/17/2023    [facility] 11/17/2023, 11:33 AM

## 2023-11-17 NOTE — TOC Transition Note (Signed)
 Transition of Care [facility]) - Discharge Note   Patient Details  Name: Cassidy Stevens MRN: [id number] Date of Birth: October 11, 1960  [facility]) CM/SW Contact:  Amada Jupiter, LCSW Phone Number: 11/17/2023, 1:21 PM   Clinical Narrative:     Met with pt who confirms she has needed DME in the home.  Therapy f/u plan changed to HHPT and pt has no agency preference.  Able to secure coverage with [facility] - info placed on AVS.  No further TOC needs.  Final next level of care: Home w Home Health Services Barriers to Discharge: No Barriers Identified   Patient Goals and CMS Choice Patient states their goals for this hospitalization and ongoing recovery are:: return home          Discharge Placement                       Discharge Plan and Services Additional resources added to the After Visit Summary for                  DME Arranged: N/A DME Agency: NA       HH Arranged: PT HH Agency: [facility] Date [facility] Agency Contacted: 11/17/23 Time HH Agency Contacted: 1321 Representative spoke with at [facility] Agency: Clifton Custard  Social Drivers of Health (SDOH) Interventions SDOH Screenings   Food Insecurity: No Food Insecurity (11/16/2023)  Housing: Low Risk  (11/16/2023)  Transportation Needs: No Transportation Needs (11/16/2023)  Utilities: Not At Risk (11/16/2023)  Depression (PHQ2-9): Low Risk  (03/11/2023)  Tobacco Use: High Risk (11/16/2023)     Readmission Risk Interventions    11/17/2023    1:20 PM 03/28/2023    2:52 PM  Readmission Risk Prevention Plan  Post Dischage Appt Complete   Medication Screening Complete   Transportation Screening Complete Complete  PCP or Specialist Appt within 5-7 Days  Complete  Home Care Screening  Complete  Medication Review (RN CM)  Complete

## 2023-11-18 ENCOUNTER — Ambulatory Visit: Payer: Medicaid Other

## 2023-11-18 DIAGNOSIS — M1711 Unilateral primary osteoarthritis, right knee: Secondary | ICD-10-CM | POA: Diagnosis present

## 2023-11-18 MED ORDER — HYDROMORPHONE HCL 2 MG PO TABS
2.0000 mg | ORAL_TABLET | ORAL | Status: DC | PRN
  Administered 2023-11-18 – 2023-11-19 (×8): 2 mg via ORAL
  Filled 2023-11-18 (×8): qty 1

## 2023-11-18 NOTE — Progress Notes (Signed)
 Subjective: 2 Days Post-Op Procedure(s) (LRB): RIGHT TOTAL KNEE ARTHROPLASTY (Right) Patient reports pain as 6 on 0-10 scale.  She did better with physical therapy this afternoon.  Feels like she will be ready to go home in the morning.  We switched her oral medication to p.o. Dilaudid and this has helped somewhat.  Taking by mouth and voiding okay.  Objective: Vital signs in last 24 hours: Temp:  [98.1 F (36.7 C)-98.7 F (37.1 C)] 98.1 F (36.7 C) (02/19 1337) Pulse Rate:  [92-100] 99 (02/19 1337) Resp:  [15-18] 16 (02/19 1337) BP: (112-135)/(77-91) 112/77 (02/19 1337) SpO2:  [96 %-100 %] 100 % (02/19 1337)  Intake/Output from previous day: 02/18 0701 - 02/19 0700 In: 440 [P.O.:440] Out: 500 [Urine:500] Intake/Output this shift: Total I/O In: 240 [P.O.:240] Out: -   No results for input(s): "HGB" in the last 72 hours. No results for input(s): "WBC", "RBC", "HCT", "PLT" in the last 72 hours. No results for input(s): "NA", "K", "CL", "CO2", "BUN", "CREATININE", "GLUCOSE", "CALCIUM" in the last 72 hours. No results for input(s): "LABPT", "INR" in the last 72 hours. Right knee exam: Moderate swelling in her leg.  Her calf is soft and nontender.  Her knee dressing is clean and dry. Neurologically intact Sensation intact distally Intact pulses distally Dorsiflexion/Plantar flexion intact No cellulitis present Compartment soft   Assessment/Plan: 2 Days Post-Op Procedure(s) (LRB): RIGHT TOTAL KNEE ARTHROPLASTY (Right) Plan: Plan for discharge tomorrow Weight-bear as tolerated on right. 81 mg aspirin twice daily x 1 month postop for DVT prophylaxis. She has been set up for home health physical therapy which should start on Friday. Follow-up with Dr. Luiz Blare in 10 to 14 days.   Anticipated LOS equal to or greater than 2 midnights due to - Age 63 and older with one or more of the following:  - Obesity  - Expected need for hospital services (PT, OT, Nursing) required for  safe  discharge  - Anticipated need for postoperative skilled nursing care or inpatient rehab  - Active co-morbidities: Chronic pain requiring opiods OR   - Unanticipated findings during/Post Surgery: Slow post-op progression: GI, pain control, mobility  - Patient is a high risk of re-admission due to: Barriers to post-acute care (logistical, no family support in home)   Matthew Folks 11/18/2023, 4:05 PM

## 2023-11-18 NOTE — Progress Notes (Signed)
 Physical Therapy Treatment Patient Details Name: Cassidy Stevens MRN: [id number] DOB: [birth year] Today's Date: 11/18/2023   History of Present Illness 63 yo female  s/p TKA on 11/17/23. PMH: R THA,  cirrhosis, COPD    PT Comments  POD #2 pm session PT - Cognition Comments: AxO x 3 pleasant Lady who lives with her Son and his family.  Issued of pain control and c/o increased swelling. Assisted OOB to amb an increased distance however pt c/o increased nausea along with pain.  Assisted to [facility] then back to bed.  Unable to tolerate PM TE's.  Applied ICE. Pt did NOT meet her mobility goals to D/C to home today due to pain control and nausea.  Have yet to practice stairs.    If plan is discharge home, recommend the following: Help with stairs or ramp for entrance;Assistance with cooking/housework;Assist for transportation   Can travel by private vehicle        Equipment Recommendations  None recommended by PT    Recommendations for Other Services       Precautions / Restrictions Precautions Precautions: Fall;Knee Recall of Precautions/Restrictions: Intact Precaution/Restrictions Comments: no pillow under knee Restrictions Weight Bearing Restrictions Per Provider Order: No RLE Weight Bearing Per Provider Order: Weight bearing as tolerated     Mobility  Bed Mobility Overal bed mobility: Needs Assistance Bed Mobility: Supine to Sit, Sit to Supine     Supine to sit: Supervision Sit to supine: Supervision, Contact guard assist   General bed mobility comments: pt able to self assist RLE using gait belt, incr time needed    Transfers Overall transfer level: Needs assistance Equipment used: Rolling walker (2 wheels) Transfers: Sit to/from Stand Sit to Stand: Supervision, Contact guard assist           General transfer comment: cues for hand placement and safety; STS from bed and toilet with increased time    Ambulation/Gait Ambulation/Gait assistance: Min assist,  Contact guard assist Gait Distance (Feet): 38 Feet Assistive device: Rolling walker (2 wheels) Gait Pattern/deviations: Step-to pattern, Decreased stance time - right Gait velocity: decreased     General Gait Details: increased time and VC's safety with turns.  Distance limited by pain level plus nausea.   Stairs             Wheelchair Mobility     Tilt Bed    Modified Rankin (Stroke Patients Only)       Balance                                            Communication    Cognition Arousal: Alert Behavior During Therapy: WFL for tasks assessed/performed   PT - Cognitive impairments: No apparent impairments                       PT - Cognition Comments: AxO x 3 pleasant Lady who lives with her Son and his family.  Issued of pain control and c/o increased swelling.        Cueing    Exercises      General Comments        Pertinent Vitals/Pain Pain Assessment Pain Assessment: 0-10 Pain Score: 8  Pain Location: right knee Pain Descriptors / Indicators: Sore, Grimacing, Guarding Pain Intervention(s): Premedicated before session, Repositioned, Ice applied    Home Living  Prior Function            PT Goals (current goals can now be found in the care plan section) Progress towards PT goals: Progressing toward goals    Frequency    7X/week      PT Plan      Co-evaluation              AM-PAC PT "6 Clicks" Mobility   Outcome Measure  Help needed turning from your back to your side while in a flat bed without using bedrails?: A Little Help needed moving from lying on your back to sitting on the side of a flat bed without using bedrails?: A Little Help needed moving to and from a bed to a chair (including a wheelchair)?: A Little Help needed standing up from a chair using your arms (e.g., wheelchair or bedside chair)?: A Little Help needed to walk in hospital room?: A  Little Help needed climbing 3-5 steps with a railing? : A Lot 6 Click Score: 17    End of Session Equipment Utilized During Treatment: Gait belt Activity Tolerance: Patient limited by pain;Patient tolerated treatment well Patient left: in bed;with call bell/phone within reach;with bed alarm set Nurse Communication: Mobility status PT Visit Diagnosis: Other abnormalities of gait and mobility (R26.89)     Time: 2956-2130 PT Time Calculation (min) (ACUTE ONLY): 30 min  Charges:    $Gait Training: 8-22 mins $Therapeutic Activity: 8-22 mins PT General Charges $$ ACUTE PT VISIT: 1 Visit                     Felecia Shelling  PTA Acute  Rehabilitation Services Office M-F          [phone]

## 2023-11-18 NOTE — Discharge Summary (Signed)
 Patient ID: Cassidy Stevens MRN: [id number] DOB/AGE: 1961-03-18 63 y.o.  Admit date: 11/16/2023 Discharge date: 11/19/2023  Admission Diagnoses:  Principal Problem:   Primary osteoarthritis of right knee Active Problems:   S/P TKR (total knee replacement), right   Discharge Diagnoses:  Same  Past Medical History:  Diagnosis Date   Anxiety    Arthritis    Cancer (HCC)    melnoma on nose   Cirrhosis (HCC)    Liver Cirrhosis   COPD (chronic obstructive pulmonary disease) (HCC)    Depression    Fibromyalgia    GERD (gastroesophageal reflux disease)    Headache    Hepatitis C    IVDU (intravenous drug user)    Remote history   Myocardial infarction (HCC) 2012   seen in [place]   Osteoporosis    Substance abuse (HCC) 2009   Hx ETOH    Surgeries: Procedure(s): RIGHT TOTAL KNEE ARTHROPLASTY on 11/16/2023   Consultants:   Discharged Condition: Improved  Hospital Course: Cassidy Stevens is an 63 y.o. female who was admitted 11/16/2023 for operative treatment ofPrimary osteoarthritis of right knee. Patient has severe unremitting pain that affects sleep, daily activities, and work/hobbies. After pre-op clearance the patient was taken to the operating room on 11/16/2023 and underwent  Procedure(s): RIGHT TOTAL KNEE ARTHROPLASTY.    Patient was given perioperative antibiotics:  Anti-infectives (From admission, onward)    Start     Dose/Rate Route Frequency Ordered Stop   11/16/23 0845  ceFAZolin (ANCEF) IVPB 2g/100 mL premix        2 g 200 mL/hr over 30 Minutes Intravenous On call to O.R. 11/16/23 4098 11/16/23 1016        Patient was given sequential compression devices, early ambulation, and chemoprophylaxis to prevent DVT.  The patient made slow progress with physical therapy and had significant pain control issues which required IV pain medication.  On postop day #3 the patient was doing much better and was progressing with PT and it was felt that she could be  discharged home safely.  Patient benefited maximally from hospital stay and there were no complications.    Recent vital signs: Patient Vitals for the past 24 hrs:  BP Temp Temp src Pulse Resp SpO2  11/18/23 1337 112/77 98.1 F (36.7 C) -- 99 16 100 %  11/18/23 0807 (!) 135/91 98.4 F (36.9 C) Oral 100 18 99 %  11/18/23 0421 129/81 98.2 F (36.8 C) Oral 97 16 97 %  11/17/23 2234 124/81 98.7 F (37.1 C) Oral 92 15 96 %     Recent laboratory studies: No results for input(s): "WBC", "HGB", "HCT", "PLT", "NA", "K", "CL", "CO2", "BUN", "CREATININE", "GLUCOSE", "INR", "CALCIUM" in the last 72 hours.  Invalid input(s): "PT", "2"   Discharge Medications:   Allergies as of 11/18/2023   No Known Allergies      Medication List     STOP taking these medications    HYDROcodone-acetaminophen 5-325 MG tablet Commonly known as: NORCO/VICODIN       TAKE these medications    albuterol 108 (90 Base) MCG/ACT inhaler Commonly known as: VENTOLIN HFA Inhale 1-2 puffs into the lungs every 6 (six) hours as needed for wheezing.   aspirin EC 81 MG tablet Take 1 tablet (81 mg total) by mouth 2 (two) times daily.   cetirizine 10 MG tablet Commonly known as: ZYRTEC Take 10 mg by mouth daily.   gabapentin 600 MG tablet Commonly known as: NEURONTIN Take 600 mg by  mouth 3 (three) times daily.   ondansetron 4 MG disintegrating tablet Commonly known as: ZOFRAN-ODT Take 4 mg by mouth every 8 (eight) hours as needed for nausea or vomiting.   oxyCODONE 5 MG immediate release tablet Commonly known as: Roxicodone Take 1 tablet (5 mg total) by mouth every 4 (four) hours as needed for severe pain (pain score 7-10).   Prolia 60 MG/ML Sosy injection Generic drug: denosumab Inject 60 mg into the skin every 6 (six) months.   tiZANidine 2 MG tablet Commonly known as: ZANAFLEX Take 1 tablet (2 mg total) by mouth every 6 (six) hours as needed for muscle spasms.   Trelegy Ellipta 200-62.5-25  MCG/INH Aepb Generic drug: Fluticasone-Umeclidin-Vilant Inhale 1 puff into the lungs daily.   Vitamin D (Ergocalciferol) 1.25 MG (50000 UNIT) Caps capsule Commonly known as: DRISDOL Take 50,000 Units by mouth once a week.               Durable Medical Equipment  (From admission, onward)           Start     Ordered   11/16/23 1402  DME Walker rolling  Once       Question:  Patient needs a walker to treat with the following condition  Answer:  Status post right knee replacement   11/16/23 1401   11/16/23 1402  DME 3 n 1  Once        11/16/23 1401              Discharge Care Instructions  (From admission, onward)           Start     Ordered   11/18/23 0000  Weight bearing as tolerated       Question Answer Comment  Laterality right   Extremity Lower      11/18/23 1613            Diagnostic Studies: No results found.  Disposition: Discharge disposition: 01-Home or Self Care       Discharge Instructions     Call MD / Call 911   Complete by: As directed    If you experience chest pain or shortness of breath, CALL 911 and be transported to the hospital emergency room.  If you develope a fever above 101 F, pus (white drainage) or increased drainage or redness at the wound, or calf pain, call your surgeon's office.   Constipation Prevention   Complete by: As directed    Drink plenty of fluids.  Prune juice may be helpful.  You may use a stool softener, such as Colace (over the counter) 100 mg twice a day.  Use MiraLax (over the counter) for constipation as needed.   Diet general   Complete by: As directed    Do not put a pillow under the knee. Place it under the heel.   Complete by: As directed    Increase activity slowly as tolerated   Complete by: As directed    Post-operative opioid taper instructions:   Complete by: As directed    POST-OPERATIVE OPIOID TAPER INSTRUCTIONS: It is important to wean off of your opioid medication as soon as  possible. If you do not need pain medication after your surgery it is ok to stop day one. Opioids include: Codeine, Hydrocodone(Norco, Vicodin), Oxycodone(Percocet, oxycontin) and hydromorphone amongst others.  Long term and even short term use of opiods can cause: Increased pain response Dependence Constipation Depression Respiratory depression And more.  Withdrawal symptoms can include Flu  like symptoms Nausea, vomiting And more Techniques to manage these symptoms Hydrate well Eat regular healthy meals Stay active Use relaxation techniques(deep breathing, meditating, yoga) Do Not substitute Alcohol to help with tapering If you have been on opioids for less than two weeks and do not have pain than it is ok to stop all together.  Plan to wean off of opioids This plan should start within one week post op of your joint replacement. Maintain the same interval or time between taking each dose and first decrease the dose.  Cut the total daily intake of opioids by one tablet each day Next start to increase the time between doses. The last dose that should be eliminated is the evening dose.      TED hose   Complete by: As directed    Use stockings (TED hose) for 3 weeks on both leg(s).  You may remove them at night for sleeping.   Weight bearing as tolerated   Complete by: As directed    Laterality: right   Extremity: Lower        Follow-up Information     Jodi Geralds, MD Follow up in 2 week(s).   Specialty: Orthopedic Surgery Contact information: [address] [place] [address] [phone]         [facility] Follow up.   Specialty: Home Health Services Why: to provide home physical therapy visits Contact information: [address] [place] [address] [phone]                  Signed: Matthew Folks 11/18/2023, 4:15 PM

## 2023-11-18 NOTE — Plan of Care (Signed)
  Problem: Clinical Measurements: Goal: Will remain free from infection Outcome: Progressing Goal: Diagnostic test results will improve Outcome: Progressing Goal: Respiratory complications will improve Outcome: Progressing Goal: Cardiovascular complication will be avoided Outcome: Progressing   Problem: Activity: Goal: Risk for activity intolerance will decrease Outcome: Progressing   Problem: Nutrition: Goal: Adequate nutrition will be maintained Outcome: Progressing   Problem: Coping: Goal: Level of anxiety will decrease Outcome: Progressing   Problem: Elimination: Goal: Will not experience complications related to bowel motility Outcome: Progressing Goal: Will not experience complications related to urinary retention Outcome: Progressing   Problem: Pain Managment: Goal: General experience of comfort will improve and/or be controlled Outcome: Progressing   Problem: Safety: Goal: Ability to remain free from injury will improve Outcome: Progressing   Problem: Skin Integrity: Goal: Risk for impaired skin integrity will decrease Outcome: Progressing   Problem: Education: Goal: Knowledge of the prescribed therapeutic regimen will improve Outcome: Progressing   Problem: Activity: Goal: Ability to avoid complications of mobility impairment will improve Outcome: Progressing Goal: Range of joint motion will improve Outcome: Progressing   Problem: Clinical Measurements: Goal: Postoperative complications will be avoided or minimized Outcome: Progressing   Problem: Pain Management: Goal: Pain level will decrease with appropriate interventions Outcome: Progressing   Problem: Skin Integrity: Goal: Will show signs of wound healing Outcome: Progressing

## 2023-11-18 NOTE — Progress Notes (Signed)
 Physical Therapy Treatment Patient Details Name: Cassidy Stevens MRN: [id number] DOB: 16-Aug-1961 Today's Date: 11/18/2023   History of Present Illness 63 yo female  s/p TKA on 11/17/23. PMH: R THA,  cirrhosis, COPD    PT Comments  POD # 2 am session PT - Cognition Comments: AxO x 3 pleasant Lady who lives with her Son and his family.  Issued of pain control and c/o increased swelling. Assisted OOB to amb in hallway was limited due to increased pain.  Excessive lean on walker and self limiting WBing.  Assisted to [facility] to void.  Assisted back to bed to perform TE's followed by ICE.   Pt will need another PT session this afternoon.     If plan is discharge home, recommend the following: Help with stairs or ramp for entrance;Assistance with cooking/housework;Assist for transportation   Can travel by private vehicle        Equipment Recommendations  None recommended by PT    Recommendations for Other Services       Precautions / Restrictions Precautions Precautions: Fall;Knee Recall of Precautions/Restrictions: Intact Precaution/Restrictions Comments: no pillow under knee Restrictions Weight Bearing Restrictions Per Provider Order: No RLE Weight Bearing Per Provider Order: Weight bearing as tolerated     Mobility  Bed Mobility Overal bed mobility: Needs Assistance Bed Mobility: Supine to Sit, Sit to Supine     Supine to sit: Supervision Sit to supine: Supervision, Contact guard assist   General bed mobility comments: pt able to self assist RLE using gait belt, incr time needed    Transfers Overall transfer level: Needs assistance Equipment used: Rolling walker (2 wheels) Transfers: Sit to/from Stand Sit to Stand: Supervision, Contact guard assist           General transfer comment: cues for hand placement and safety; STS from bed and toilet with increased time    Ambulation/Gait Ambulation/Gait assistance: Min assist, Contact guard assist Gait Distance  (Feet): 24 Feet Assistive device: Rolling walker (2 wheels) Gait Pattern/deviations: Step-to pattern, Decreased stance time - right Gait velocity: decreased     General Gait Details: increased time and VC's safety with turns.  Distance limited by pain level.   Stairs             Wheelchair Mobility     Tilt Bed    Modified Rankin (Stroke Patients Only)       Balance                                            Communication    Cognition Arousal: Alert Behavior During Therapy: WFL for tasks assessed/performed   PT - Cognitive impairments: No apparent impairments                       PT - Cognition Comments: AxO x 3 pleasant Lady who lives with her Son and his family.  Issued of pain control and c/o increased swelling.        Cueing    Exercises  Total Knee Replacement TE's following HEP handout 10 reps B LE ankle pumps 05 reps towel squeezes 05 reps knee presses 05 reps heel slides  05 reps SAQ's 05 reps SLR's 05 reps ABD Educated on use of gait belt to assist with TE's Followed by ICE     General Comments  Pertinent Vitals/Pain Pain Assessment Pain Assessment: 0-10 Pain Score: 8  Pain Location: right knee Pain Descriptors / Indicators: Sore, Grimacing, Guarding Pain Intervention(s): Premedicated before session, Repositioned, Ice applied    Home Living                          Prior Function            PT Goals (current goals can now be found in the care plan section) Progress towards PT goals: Progressing toward goals    Frequency    7X/week      PT Plan      Co-evaluation              AM-PAC PT "6 Clicks" Mobility   Outcome Measure  Help needed turning from your back to your side while in a flat bed without using bedrails?: A Little Help needed moving from lying on your back to sitting on the side of a flat bed without using bedrails?: A Little Help needed moving to and  from a bed to a chair (including a wheelchair)?: A Little Help needed standing up from a chair using your arms (e.g., wheelchair or bedside chair)?: A Little Help needed to walk in hospital room?: A Little Help needed climbing 3-5 steps with a railing? : A Lot 6 Click Score: 17    End of Session Equipment Utilized During Treatment: Gait belt Activity Tolerance: Patient limited by pain;Patient tolerated treatment well Patient left: in bed;with call bell/phone within reach;with bed alarm set Nurse Communication: Mobility status PT Visit Diagnosis: Other abnormalities of gait and mobility (R26.89)     Time: 1610-9604 PT Time Calculation (min) (ACUTE ONLY): 28 min  Charges:    $Gait Training: 8-22 mins $Therapeutic Exercise: 8-22 mins PT General Charges $$ ACUTE PT VISIT: 1 Visit                     Felecia Shelling  PTA Acute  Rehabilitation Services Office M-F          [phone]

## 2023-11-19 NOTE — Plan of Care (Signed)

## 2023-11-19 NOTE — Progress Notes (Signed)
 Physical Therapy Treatment Patient Details Name: Cassidy Stevens MRN: [id number] DOB: Feb 06, 1961 Today's Date: 11/19/2023   History of Present Illness 63 yo female  s/p TKA on 11/17/23. PMH: R THA,  cirrhosis, COPD    PT Comments  POD # 3 am session Pt feeling "better" and PAIN is mor managed.  No c/o nausea.   General bed mobility comments: self able.  Pt has been getting OOB to use BSC without assist General Gait Details: tolerated a functional distance with 4/10 knee pain. General stair comments: VC's on proper sequencing with heavy lean on R rail but performed well requiring NO physical Asisstance. Reviewed HEP.  Addressed all mobility questions, discussed appropriate activity, educated on use of ICE.  Pt ready for D/C to home.     If plan is discharge home, recommend the following: Help with stairs or ramp for entrance;Assistance with cooking/housework;Assist for transportation   Can travel by private vehicle        Equipment Recommendations  None recommended by PT    Recommendations for Other Services       Precautions / Restrictions Precautions Precautions: Fall;Knee Recall of Precautions/Restrictions: Intact Precaution/Restrictions Comments: no pillow under knee Restrictions Weight Bearing Restrictions Per Provider Order: No RLE Weight Bearing Per Provider Order: Weight bearing as tolerated     Mobility  Bed Mobility Overal bed mobility: Modified Independent             General bed mobility comments: self able.  Pt has been getting OOB to use BSC without assist    Transfers Overall transfer level: Modified independent Equipment used: Rolling walker (2 wheels) Transfers: Sit to/from Stand Sit to Stand: Modified independent (Device/Increase time)           General transfer comment: good use of hands to steady self as well as good cognition/awareness.    Ambulation/Gait Ambulation/Gait assistance: Supervision Gait Distance (Feet): 45  Feet Assistive device: Rolling walker (2 wheels) Gait Pattern/deviations: Step-to pattern, Decreased stance time - right Gait velocity: decreased     General Gait Details: tolerated a functional distance with 4/10 knee pain.   Stairs Stairs: Yes Stairs assistance: Supervision Stair Management: Two rails, Step to pattern, Forwards Number of Stairs: 2 General stair comments: VC's on proper sequencing with heavy lean on R rail but performed well requiring NO physical Asisstance.   Wheelchair Mobility     Tilt Bed    Modified Rankin (Stroke Patients Only)       Balance                                            Communication    Cognition   Behavior During Therapy: WFL for tasks assessed/performed   PT - Cognitive impairments: No apparent impairments                       PT - Cognition Comments: AxO x 3 pleasant Lady who lives with her Son and his family.  Ready to D/C today.        Cueing    Exercises      General Comments        Pertinent Vitals/Pain Pain Assessment Pain Assessment: 0-10 Pain Score: 4  Pain Location: right knee Pain Descriptors / Indicators: Sore, Grimacing, Guarding, Operative site guarding Pain Intervention(s): Monitored during session, Premedicated before session, Repositioned, Ice applied  Home Living                          Prior Function            PT Goals (current goals can now be found in the care plan section) Progress towards PT goals: Progressing toward goals    Frequency    7X/week      PT Plan      Co-evaluation              AM-PAC PT "6 Clicks" Mobility   Outcome Measure  Help needed turning from your back to your side while in a flat bed without using bedrails?: None Help needed moving from lying on your back to sitting on the side of a flat bed without using bedrails?: None Help needed moving to and from a bed to a chair (including a wheelchair)?:  None Help needed standing up from a chair using your arms (e.g., wheelchair or bedside chair)?: None Help needed to walk in hospital room?: None Help needed climbing 3-5 steps with a railing? : A Little 6 Click Score: 23    End of Session Equipment Utilized During Treatment: Gait belt Activity Tolerance: Patient tolerated treatment well Patient left: in bed;with call bell/phone within reach;with bed alarm set Nurse Communication: Mobility status PT Visit Diagnosis: Other abnormalities of gait and mobility (R26.89)     Time: 1130-1155 PT Time Calculation (min) (ACUTE ONLY): 25 min  Charges:    $Gait Training: 8-22 mins $Therapeutic Activity: 8-22 mins PT General Charges $$ ACUTE PT VISIT: 1 Visit                     Felecia Shelling  PTA Acute  Rehabilitation Services Office M-F          [phone]

## 2023-12-25 NOTE — Therapy (Signed)
 OUTPATIENT PHYSICAL THERAPY LOWER EXTREMITY EVALUATION   Patient Name: Cassidy Stevens MRN: [id number] DOB:09/28/1961, 63 y.o., female Today's Date: 12/28/2023  END OF SESSION:  PT End of Session - 12/28/23 1059     Visit Number 1    Number of Visits 17    Date for PT Re-Evaluation 02/22/24    Authorization Type [employer]    Authorization Time Period auth tbd    PT Start Time 1101    PT Stop Time 1141    PT Time Calculation (min) 40 min    Activity Tolerance Patient tolerated treatment well             Past Medical History:  Diagnosis Date   Anxiety    Arthritis    Cancer (HCC)    melnoma on nose   Cirrhosis (HCC)    Liver Cirrhosis   COPD (chronic obstructive pulmonary disease) (HCC)    Depression    Fibromyalgia    GERD (gastroesophageal reflux disease)    Headache    Hepatitis C    IVDU (intravenous drug user)    Remote history   Myocardial infarction (HCC) 2012   seen in [place]   Osteoporosis    Substance abuse (HCC) 2009   Hx ETOH   Past Surgical History:  Procedure Laterality Date   ABDOMINAL HYSTERECTOMY     FACIAL RECONSTRUCTION SURGERY     KNEE SURGERY Right    meniscus repair   TOTAL HIP ARTHROPLASTY Right 03/27/2023   Procedure: TOTAL HIP ARTHROPLASTY ANTERIOR APPROACH;  Surgeon: Gean Birchwood, MD;  Location: [facility];  Service: Orthopedics;  Laterality: Right;   TOTAL KNEE ARTHROPLASTY Right 11/16/2023   Procedure: RIGHT TOTAL KNEE ARTHROPLASTY;  Surgeon: Jodi Geralds, MD;  Location: [facility];  Service: Orthopedics;  Laterality: Right;   Patient Active Problem List   Diagnosis Date Noted   Primary osteoarthritis of right knee 11/18/2023   S/P TKR (total knee replacement), right 11/16/2023   Closed right hip fracture (HCC) 03/26/2023   Hypokalemia 03/26/2023   Fall at home, initial encounter 03/26/2023   Leukocytosis 03/26/2023   SIRS (systemic inflammatory response syndrome) (HCC) 03/26/2023   Cirrhosis (HCC) 03/26/2023    History of COPD 03/26/2023   Tobacco abuse 03/26/2023   Chronic hyponatremia 03/26/2023   Pain in both lower extremities 11/27/2021   Chronic midline low back pain without sciatica 11/27/2021   Chronic neck pain 11/27/2021    PCP: Remus Loffler, PA-C   REFERRING PROVIDER: Jodi Geralds, MD  REFERRING DIAG: S/P  RIGHT TKA  THERAPY DIAG:  Right knee pain, unspecified chronicity  Other abnormalities of gait and mobility  Localized edema  Rationale for Evaluation and Treatment: Rehabilitation  ONSET DATE: R TKA DOS 11/16/23  SUBJECTIVE:   SUBJECTIVE STATEMENT: Had hx of hip and back pain, prior knee surgery (meniscus in 2012). Had difficulty walking and doing stairs. Does note some ankle pain since surgery when she walks more, feels it is due to change in walking mechanics, states it isn't really changing much. Was using a walker initially after surgery, has since weaned. Finished up with HHPT about a week ago. Also saw surgeon about a week ago, no concerns. No calf pain, no N/T.   PERTINENT HISTORY: anxiety/depression, fibromyalgia, cancer, cirrhosis, COPD, hep C, MI, osteoporosis, hx R THA, self reports lumbar compression fractures PAIN:  Are you having pain: 5/10 Location/description: L knee, L ankle  Best-worst over past week: 3-8/10  - aggravating factors: straightening knee, household  mobility, stairs  - Easing factors: rest, lying on side, bending leg, icing  PRECAUTIONS: cancer hx, cardiac hx, hep C, osteoporosis ; pt self reports lumbar compression fx   RED FLAGS: None   WEIGHT BEARING RESTRICTIONS: No  FALLS:  Has patient fallen in last 6 months? No  LIVING ENVIRONMENT: Lives w/ son and 6 kids in mobile home 4-5STE one rail  OCCUPATION: disabled ~13 years, used to do factory work  PLOF: Independent  PATIENT GOALS: be able to go to park with grandchildren, go hiking, gardening, start going to gym with son  NEXT MD VISIT: about a month from  eval  OBJECTIVE:  Note: Objective measures were completed at Evaluation unless otherwise noted.  DIAGNOSTIC FINDINGS:  DOS 11/16/23 L TKA  PATIENT SURVEYS:  LEFS 15/80  COGNITION: Overall cognitive status: Within functional limits for tasks assessed     SENSATION: Mild reduction in LT lateral R knee, otherwise intact  EDEMA/INSPECTION:   Incision is well appearing ; edema WNL about the knee ; no calf pain or swelling, does have some tenderness lateral R ankle and bruising. Has pain with gross ankle movement all directions, concordant about lateral ankle, does not elicit any superior/calf pain.   PALPATION: Tenderness about R quad as expected post op ; no calf tenderness ; gross tenderness about R knee joint as expected  LOWER EXTREMITY ROM:     Active  Right eval Left eval  Hip flexion    Hip extension    Hip internal rotation    Hip external rotation    Knee extension Lacking 12    Knee flexion 109 deg   (Blank rows = not tested) (Key: WFL = within functional limits not formally assessed, * = concordant pain, s = stiffness/stretching sensation, NT = not tested)  Comments:    LOWER EXTREMITY MMT:    MMT Right eval Left eval  Hip flexion    Hip abduction (modified sitting)    Hip internal rotation    Hip external rotation    Knee flexion 4- nonpainful    Knee extension 3+ nonpainful    Ankle dorsiflexion     (Blank rows = not tested) (Key: WFL = within functional limits not formally assessed, * = concordant pain, s = stiffness/stretching sensation, NT = not tested)  Comments:     FUNCTIONAL TESTS:  TUG: 10.54sec no AD 5xSTS: 15.29sec UE support from thighs, increased pain w/ repetition ; reduced full knee extension, mildly inc WB through LLE  GAIT: Distance walked: within clinic Assistive device utilized: None Level of assistance: Complete Independence Comments: antalgic gait, reduced knee ROM throughout full gait cycle but especially in stance                                                                                                                                 TREATMENT DATE:  [facility] Adult PT Treatment:  DATE: 12/28/23 Therapeutic Exercise: Seated quad set, tailgate knee ext AAROM, STS practice reps + HEP education/handout  Self Care: PT POC, impairments as they relate to functional mobility, monitoring for post op red flags and appropriate action     PATIENT EDUCATION:  Education details: Pt education on PT impairments, prognosis, and POC. Informed consent. Rationale for interventions, safe/appropriate HEP performance Person educated: Patient Education method: Explanation, Demonstration, Tactile cues, Verbal cues Education comprehension: verbalized understanding, returned demonstration, verbal cues required, tactile cues required, and needs further education    HOME EXERCISE PROGRAM: Access Code: [email or web address] URL: [email or web address] Date: 12/28/2023 Prepared by: Fransisco Hertz  Exercises - Seated Quad Set  - 2-3 x daily - 1 sets - 10 reps - Seated Knee Extension AAROM  - 2-3 x daily - 1 sets - 10 reps - Sit to Stand with Armchair  - 2-3 x daily - 1 sets - 5-8 reps  ASSESSMENT:  CLINICAL IMPRESSION: Pt is a 63 year old woman who arrives to PT evaluation on this date for R TKA DOS 11/16/23. Pt reports difficulty with functional mobility due to pain/weakness as expected post op. During today's session pt demonstrates altered gait/transfer mechanics, limited knee strength/mobility. Knee flexion ROM is as expected post op but she is lacking around 12 deg of knee extension which is likely contributing to altered kinematics. Also has mild swelling/tenderness as expected post op. No red flags apparent today, although did encourage her to discuss R ankle pain further w/ physician as there is some mild bruising and she states it has been consistent since surgery. Recommend  skilled PT to address aforementioned deficits to improve functional independence/tolerance. No adverse events, pt does well with exam/HEP overall. Pt departs today's session in no acute distress, all voiced questions/concerns addressed appropriately from PT perspective.    OBJECTIVE IMPAIRMENTS: Abnormal gait, decreased activity tolerance, decreased balance, decreased endurance, decreased mobility, difficulty walking, decreased ROM, decreased strength, increased edema, impaired flexibility, impaired sensation, improper body mechanics, and pain.   ACTIVITY LIMITATIONS: carrying, lifting, bending, standing, squatting, sleeping, stairs, transfers, and locomotion level  PARTICIPATION LIMITATIONS: meal prep, cleaning, laundry, driving, community activity, and yard work  PERSONAL FACTORS: Age, Time since onset of injury/illness/exacerbation, and 3+ comorbidities: anxiety/depression, fibromyalgia, cancer, cirrhosis, COPD, hep C, MI, osteoporosis, hx L THA  are also affecting patient's functional outcome.   REHAB POTENTIAL: Good  CLINICAL DECISION MAKING: Evolving/moderate complexity  EVALUATION COMPLEXITY: Moderate   GOALS:   SHORT TERM GOALS: Target date: 01/25/2024   Pt will demonstrate appropriate understanding and performance of initially prescribed HEP in order to facilitate improved independence with management of symptoms.  Baseline: HEP established  Goal status: INITIAL   2. Pt will report at least 25% improvement in overall pain levels over past week in order to facilitate improved tolerance to typical daily activities.   Baseline: 3-8/10  Goal status: INITIAL    LONG TERM GOALS: Target date: 02/22/2024   Pt will score 32 or greater on LEFS in order to demonstrate improved perception of function due to symptoms (MCID 9 pts) Baseline: 15/80 Goal status: INITIAL  2.  Pt will demonstrate at least 0-110 degrees of knee AROM on surgical limb in order to facilitate improved tolerance  to functional movements such as squatting, walking, and stair navigation.  Baseline: see ROM chart above Goal status: INITIAL  3.  Pt will demonstrate appropriate performance of final prescribed HEP in order to facilitate improved self-management of symptoms post-discharge.   Baseline: initial HEP prescribed  Goal status: INITIAL    4.  Pt will be able to perform 5xSTS in less than or equal to 11sec without UE support in order to demonstrate reduced fall risk and improved functional independence (MCID 5xSTS = 2.3 sec). Baseline: 15sec w/ UE support from thighs Goal status: INITIAL   5. Pt will report at least 50% decrease in overall pain levels in past week in order to facilitate improved tolerance to basic ADLs/mobility.   Baseline: 3-8/10  Goal status: INITIAL    6. Pt will demonstrate symmetrical knee MMT between surgical and non-surgical limb in order to facilitate improved functional strength and mechanics.  Baseline: see MMT chart  Goal status: INITIAL     PLAN:  PT FREQUENCY: 2x/week  PT DURATION: 8 weeks  PLANNED INTERVENTIONS: [id number]- PT Re-evaluation, 97110-Therapeutic exercises, 97530- Therapeutic activity, [id number]- Neuromuscular re-education, 97535- Self Care, 95621- Manual therapy, [id number]- Gait training, [phone]- Aquatic Therapy, [phone]- Vasopneumatic device, Patient/Family education, Balance training, Stair training, Taping, Dry Needling, Joint mobilization, Cryotherapy, and Moist heat  PLAN FOR NEXT SESSION: Review/update HEP PRN. Work on [employer] exercises as appropriate with emphasis on quad activation, knee extension ROM, and functional mechanics. Symptom modification strategies as indicated/appropriate, mindful of cancer history, osteoporosis, cardiac hx, self reported compression fractures.   Ashley Murrain PT, DPT 12/28/2023 1:00 PM    For all possible CPT codes, reference the Planned Interventions line above.     Check all conditions that are expected to  impact treatment: {Conditions expected to impact treatment:Musculoskeletal disorders   If treatment provided at initial evaluation, no treatment charged due to lack of authorization.

## 2023-12-28 ENCOUNTER — Ambulatory Visit: Attending: Orthopedic Surgery | Admitting: Physical Therapy

## 2023-12-28 ENCOUNTER — Encounter: Payer: Self-pay | Admitting: Physical Therapy

## 2023-12-28 ENCOUNTER — Other Ambulatory Visit: Payer: Self-pay

## 2023-12-28 DIAGNOSIS — R2689 Other abnormalities of gait and mobility: Secondary | ICD-10-CM | POA: Insufficient documentation

## 2023-12-28 DIAGNOSIS — R600 Localized edema: Secondary | ICD-10-CM | POA: Diagnosis present

## 2023-12-28 DIAGNOSIS — M25561 Pain in right knee: Secondary | ICD-10-CM | POA: Diagnosis present

## 2023-12-31 ENCOUNTER — Ambulatory Visit

## 2024-01-07 NOTE — Addendum Note (Signed)
 Addended by: Rise Paganini on: 01/07/2024 08:45 AM   Modules accepted: Orders

## 2024-01-12 ENCOUNTER — Ambulatory Visit: Admitting: Physical Therapy

## 2024-01-14 ENCOUNTER — Ambulatory Visit: Admitting: Physical Therapy

## 2024-01-18 ENCOUNTER — Ambulatory Visit: Admitting: Physical Therapy

## 2024-01-20 ENCOUNTER — Ambulatory Visit

## 2024-01-26 ENCOUNTER — Encounter: Admitting: Physical Therapy

## 2024-01-26 ENCOUNTER — Other Ambulatory Visit: Payer: Self-pay | Admitting: Nurse Practitioner

## 2024-01-26 DIAGNOSIS — K7469 Other cirrhosis of liver: Secondary | ICD-10-CM

## 2024-01-28 ENCOUNTER — Encounter: Admitting: Physical Therapy

## 2024-01-29 ENCOUNTER — Other Ambulatory Visit

## 2024-02-04 ENCOUNTER — Other Ambulatory Visit
